# Patient Record
Sex: Female | Born: 1970 | Race: Black or African American | Hispanic: No | Marital: Married | Smoking: Never smoker
Health system: Southern US, Community
[De-identification: ages and names within clinical notes are randomized; demographics above are authoritative.]

## PROBLEM LIST (undated history)

## (undated) ENCOUNTER — Emergency Department (HOSPITAL_COMMUNITY): Admission: EM | Payer: Self-pay | Source: Home / Self Care

## (undated) DIAGNOSIS — E237 Disorder of pituitary gland, unspecified: Secondary | ICD-10-CM

## (undated) DIAGNOSIS — N289 Disorder of kidney and ureter, unspecified: Secondary | ICD-10-CM

## (undated) DIAGNOSIS — I129 Hypertensive chronic kidney disease with stage 1 through stage 4 chronic kidney disease, or unspecified chronic kidney disease: Secondary | ICD-10-CM

## (undated) DIAGNOSIS — N189 Chronic kidney disease, unspecified: Secondary | ICD-10-CM

## (undated) DIAGNOSIS — E119 Type 2 diabetes mellitus without complications: Secondary | ICD-10-CM

## (undated) HISTORY — PX: ABDOMINAL HYSTERECTOMY: SHX81

## (undated) HISTORY — PX: ADVANCEMENT / RECONSTRUCTION POSTERIOR TIBIAL TENDON / KIDNER: SUR17

---

## 2001-01-12 ENCOUNTER — Encounter: Admission: RE | Admit: 2001-01-12 | Discharge: 2001-01-12 | Payer: Self-pay | Admitting: Orthopedic Surgery

## 2014-01-22 ENCOUNTER — Emergency Department (HOSPITAL_COMMUNITY): Payer: Medicare Other

## 2014-01-22 ENCOUNTER — Emergency Department (HOSPITAL_COMMUNITY)
Admission: EM | Admit: 2014-01-22 | Discharge: 2014-01-22 | Disposition: A | Discharge status: Home / Self Care | Payer: Medicare Other | Attending: Emergency Medicine | Admitting: Emergency Medicine

## 2014-01-22 ENCOUNTER — Encounter (HOSPITAL_COMMUNITY): Payer: Self-pay | Admitting: Emergency Medicine

## 2014-01-22 DIAGNOSIS — Z862 Personal history of diseases of the blood and blood-forming organs and certain disorders involving the immune mechanism: Secondary | ICD-10-CM | POA: Diagnosis not present

## 2014-01-22 DIAGNOSIS — M545 Low back pain, unspecified: Secondary | ICD-10-CM | POA: Insufficient documentation

## 2014-01-22 DIAGNOSIS — M25562 Pain in left knee: Secondary | ICD-10-CM

## 2014-01-22 DIAGNOSIS — IMO0002 Reserved for concepts with insufficient information to code with codable children: Secondary | ICD-10-CM | POA: Insufficient documentation

## 2014-01-22 DIAGNOSIS — M542 Cervicalgia: Secondary | ICD-10-CM

## 2014-01-22 DIAGNOSIS — Z87448 Personal history of other diseases of urinary system: Secondary | ICD-10-CM | POA: Insufficient documentation

## 2014-01-22 DIAGNOSIS — M25572 Pain in left ankle and joints of left foot: Secondary | ICD-10-CM

## 2014-01-22 DIAGNOSIS — M25579 Pain in unspecified ankle and joints of unspecified foot: Secondary | ICD-10-CM | POA: Insufficient documentation

## 2014-01-22 DIAGNOSIS — W19XXXD Unspecified fall, subsequent encounter: Secondary | ICD-10-CM

## 2014-01-22 DIAGNOSIS — M25569 Pain in unspecified knee: Secondary | ICD-10-CM | POA: Diagnosis not present

## 2014-01-22 DIAGNOSIS — Z79899 Other long term (current) drug therapy: Secondary | ICD-10-CM | POA: Insufficient documentation

## 2014-01-22 DIAGNOSIS — Z91040 Latex allergy status: Secondary | ICD-10-CM | POA: Insufficient documentation

## 2014-01-22 DIAGNOSIS — Z8639 Personal history of other endocrine, nutritional and metabolic disease: Secondary | ICD-10-CM | POA: Insufficient documentation

## 2014-01-22 DIAGNOSIS — M549 Dorsalgia, unspecified: Secondary | ICD-10-CM | POA: Diagnosis present

## 2014-01-22 HISTORY — DX: Disorder of pituitary gland, unspecified: E23.7

## 2014-01-22 HISTORY — DX: Disorder of kidney and ureter, unspecified: N28.9

## 2014-01-22 MED ORDER — OXYCODONE-ACETAMINOPHEN 5-325 MG PO TABS
1.0000 | ORAL_TABLET | Freq: Once | ORAL | Status: AC
  Administered 2014-01-22: 1 via ORAL
  Filled 2014-01-22: qty 1

## 2014-01-22 MED ORDER — CYCLOBENZAPRINE HCL 10 MG PO TABS
10.0000 mg | ORAL_TABLET | Freq: Once | ORAL | Status: AC
  Administered 2014-01-22: 10 mg via ORAL
  Filled 2014-01-22: qty 1

## 2014-01-22 MED ORDER — PREDNISONE 10 MG PO TABS: 20.0000 mg | ORAL_TABLET | Freq: Every day | ORAL | Status: DC

## 2014-01-22 MED ORDER — OXYCODONE-ACETAMINOPHEN 5-325 MG PO TABS: 1.0000 | ORAL_TABLET | ORAL | Status: DC | PRN

## 2014-01-22 NOTE — ED Provider Notes (Signed)
CSN: [id number]     Arrival date & time 01/22/14  1818 History   First MD Initiated Contact with Patient 01/22/14 2048     Chief Complaint  Patient presents with  . Back Pain     (Consider location/radiation/quality/duration/timing/severity/associated sxs/prior Treatment) HPI.... status post fall at [employer] on July 25. She was initially evaluated at [facility].  She continues to complain of neck pain, low back pain, left knee pain, left ankle pain. No loss of consciousness. No neurological deficits.  . Mild to moderate. Positioning and palpation makes pain worse.  Past Medical History  Diagnosis Date  . Renal disorder   . Pituitary abnormality    Past Surgical History  Procedure Laterality Date  . Advancement / reconstruction posterior tibial tendon / kidner    . Cesarean section    . Abdominal hysterectomy     No family history on file. History  Substance Use Topics  . Smoking status: Never Smoker   . Smokeless tobacco: Not on file  . Alcohol Use: No   OB History   Grav Para Term Preterm Abortions TAB SAB Ect Mult Living                 Review of Systems  All other systems reviewed and are negative.     Allergies  Ibuprofen and Latex  Home Medications   Prior to Admission medications   Medication Sig Start Date End Date Taking? Authorizing Provider  albuterol (PROAIR HFA) 108 (90 BASE) MCG/ACT inhaler Inhale 2 puffs into the lungs every 4 (four) hours as needed for wheezing or shortness of breath.   Yes Historical Provider, MD  budesonide-formoterol (SYMBICORT) 160-4.5 MCG/ACT inhaler Inhale 2 puffs into the lungs 2 (two) times daily.   Yes Historical Provider, MD  bumetanide (BUMEX) 2 MG tablet Take 2 mg by mouth 2 (two) times daily.   Yes Historical Provider, MD  Cholecalciferol (VITAMIN D PO) Take 1 tablet by mouth daily.   Yes Historical Provider, MD  cyclobenzaprine (FLEXERIL) 10 MG tablet Take 10 mg by mouth 3 (three) times daily.   Yes Historical  Provider, MD  estradiol (CLIMARA - DOSED IN MG/24 HR) 0.05 mg/24hr patch Place 0.05 mg onto the skin once a week.   Yes Historical Provider, MD  ferrous sulfate 325 (65 FE) MG tablet Take 325 mg by mouth daily.   Yes Historical Provider, MD  furosemide (LASIX) 80 MG tablet Take 80 mg by mouth 2 (two) times daily.   Yes Historical Provider, MD  MAGNESIUM PO Take 1 tablet by mouth daily.   Yes Historical Provider, MD  metolazone (ZAROXOLYN) 5 MG tablet Take 5 mg by mouth daily.   Yes Historical Provider, MD  potassium chloride (K-DUR) 10 MEQ tablet Take 10 mEq by mouth daily.   Yes Historical Provider, MD  traMADol (ULTRAM) 50 MG tablet Take by mouth 3 (three) times daily.   Yes Historical Provider, MD  valsartan (DIOVAN) 320 MG tablet Take 320 mg by mouth daily.   Yes Historical Provider, MD  oxyCODONE-acetaminophen (PERCOCET) 5-325 MG per tablet Take 1 tablet by mouth every 4 (four) hours as needed. 01/22/14   Donnetta Hutching, MD  predniSONE (DELTASONE) 10 MG tablet Take 2 tablets (20 mg total) by mouth daily. 01/22/14   Donnetta Hutching, MD   BP 129/83  Pulse 83  Temp(Src) 97.8 F (36.6 C) (Oral)  Resp 18  Ht 5\' 4"  (1.626 m)  Wt 220 lb (99.791 kg)  BMI 37.74 kg/m2  SpO2 97% Physical Exam  Nursing note and vitals reviewed. Constitutional: She is oriented to person, place, and time. She appears well-developed and well-nourished.  HENT:  Head: Normocephalic and atraumatic.  Eyes: Conjunctivae and EOM are normal. Pupils are equal, round, and reactive to light.  Neck:   Minimal posterior cervical tenderness, low back tenderness   Cardiovascular: Normal rate, regular rhythm and normal heart sounds.   Pulmonary/Chest: Effort normal and breath sounds normal.  Abdominal: Soft. Bowel sounds are normal.  Musculoskeletal: Normal range of motion.  Minimal anterior left knee tenderness, medial and lateral left ankle tenderness.  Neurological: She is alert and oriented to person, place, and time.  Skin: Skin  is warm and dry.  Psychiatric: She has a normal mood and affect. Her behavior is normal.    ED Course  Procedures (including critical care time) Labs Review Labs Reviewed - No data to display  Imaging Review No results found.   EKG Interpretation None      MDM   Final diagnoses:  Fall, subsequent encounter  Neck pain  Low back pain without sciatica, unspecified back pain laterality  Left knee pain  Left ankle pain    Plain x-rays of cervical spine, lumbar spine, left knee, left ankle negative. Discharge medications Percocet and prednisone. Orthopedic followup.    Donnetta HutchingBrian Taytum Wheller, MD 01/22/14 2207

## 2014-01-22 NOTE — Discharge Instructions (Signed)
X-rays were normal. You'll be sore for several days. Ice pack. Medication for pain and inflammation. Followup with orthopedic doctor if not getting better. Phone number given

## 2014-01-22 NOTE — ED Notes (Signed)
Pt states she is still having pain, pain meds given before, she believes was a placebo, is requesting more pain meds, MD notified.

## 2014-01-22 NOTE — ED Notes (Signed)
Pt reports fell on some wooden plans in July.  C/O pain in lower back and numbness to forearms and lower legs.

## 2014-01-23 ENCOUNTER — Telehealth: Payer: Self-pay | Admitting: Orthopedic Surgery

## 2014-01-23 NOTE — Telephone Encounter (Signed)
01/23/14 Called back to patient to relay per Dr Romeo AppleHarrison; left voice mail message.

## 2014-01-23 NOTE — Telephone Encounter (Signed)
No we will not see [place] patient.

## 2014-01-23 NOTE — Telephone Encounter (Signed)
Patient called today, 01/23/14, following [facility] visit on 01/22/14, for problems of low back pain(main problem),neck pain, left knee, and left ankle pain, related to a fall in July 2015 at [employer], according to Emergency room physician notes, and had been seen initially at [facility] at time of injury.  Patient states not much was done at [facility], and "that's why I went to [facility]."  Patient's insurance is Medicare primary, [place] Medicaid secondary, and we relayed that we are not in [place] Medicaid network; therefore, patient would be responsible for all balances following Medicare, if approved for appointment.  Please review and please advise.  Patient's ph#([place]) # [phone]

## 2016-01-16 IMAGING — CR DG CERVICAL SPINE COMPLETE 4+V
8 of 9 series · 8 of 9 positions shown · non-contrast
Comparison: None.

CLINICAL DATA: Neck pain, status post fall.

EXAM:
CERVICAL SPINE  4+ VIEWS

[view not recorded (1 of 8)]
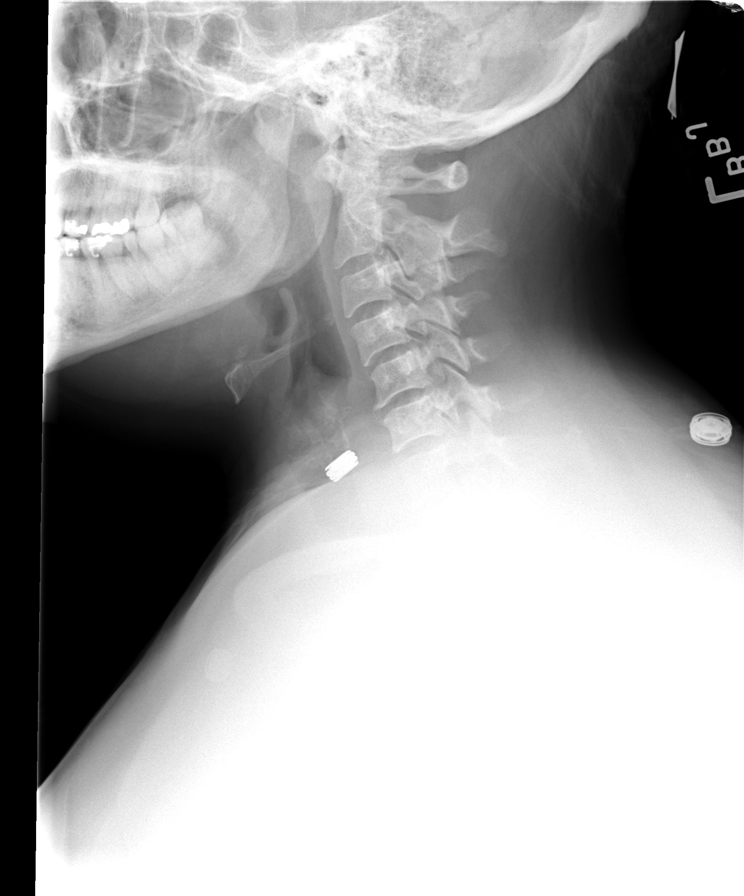

[view not recorded (2 of 8)]
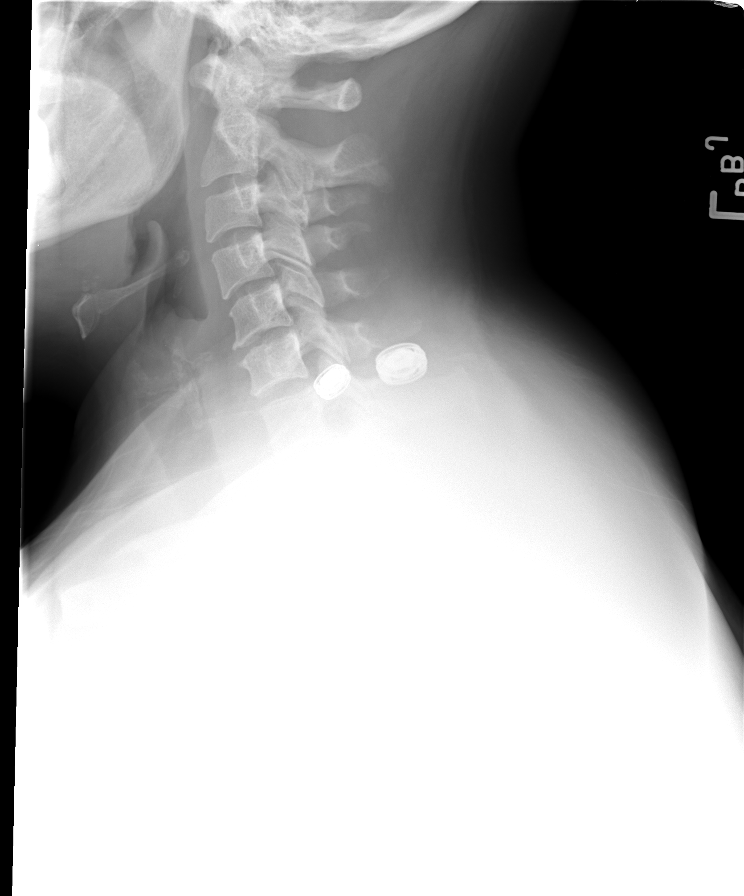

[view not recorded (3 of 8)]
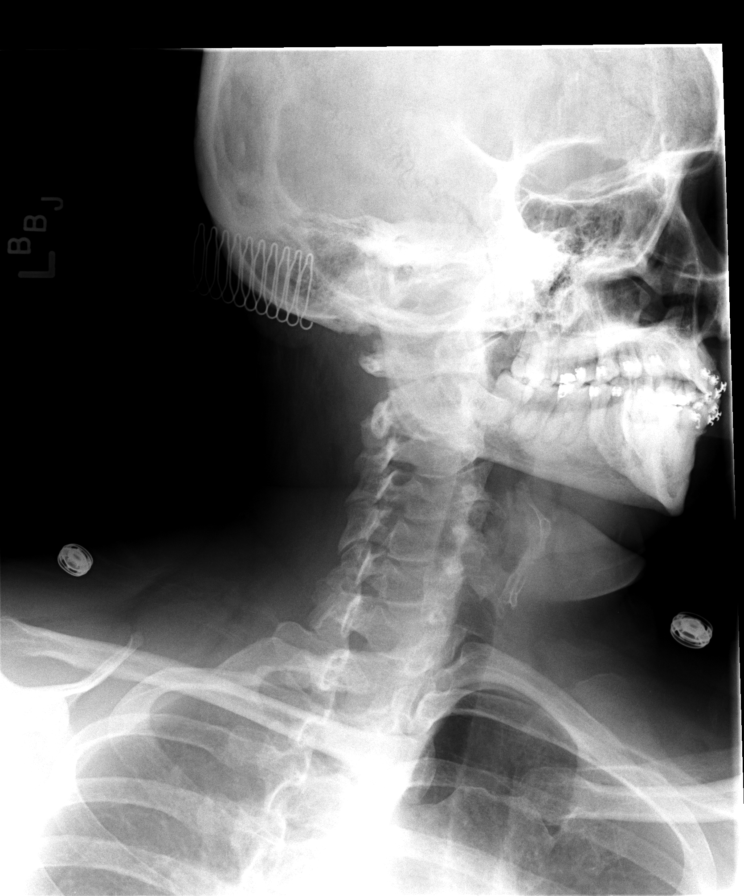

[view not recorded (4 of 8)]
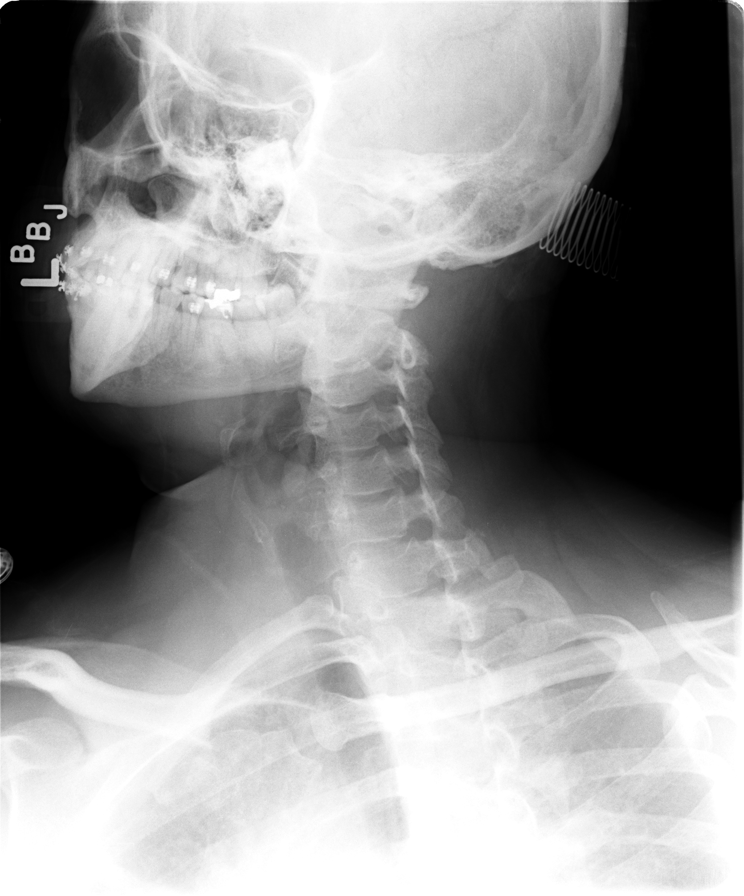

[view not recorded (5 of 8)]
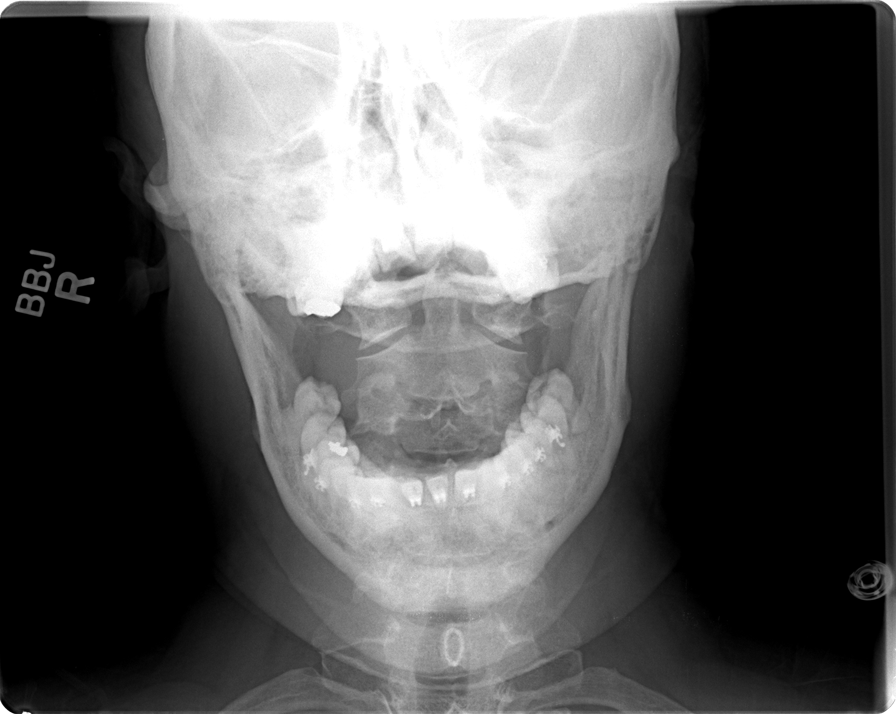

[view not recorded (6 of 8)]
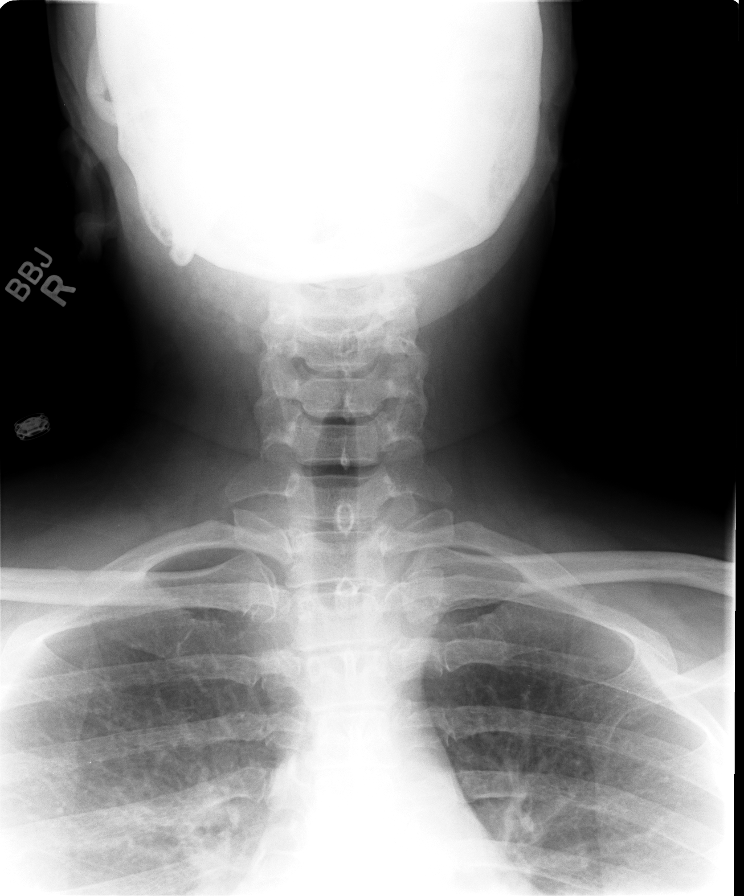

[view not recorded (7 of 8)]
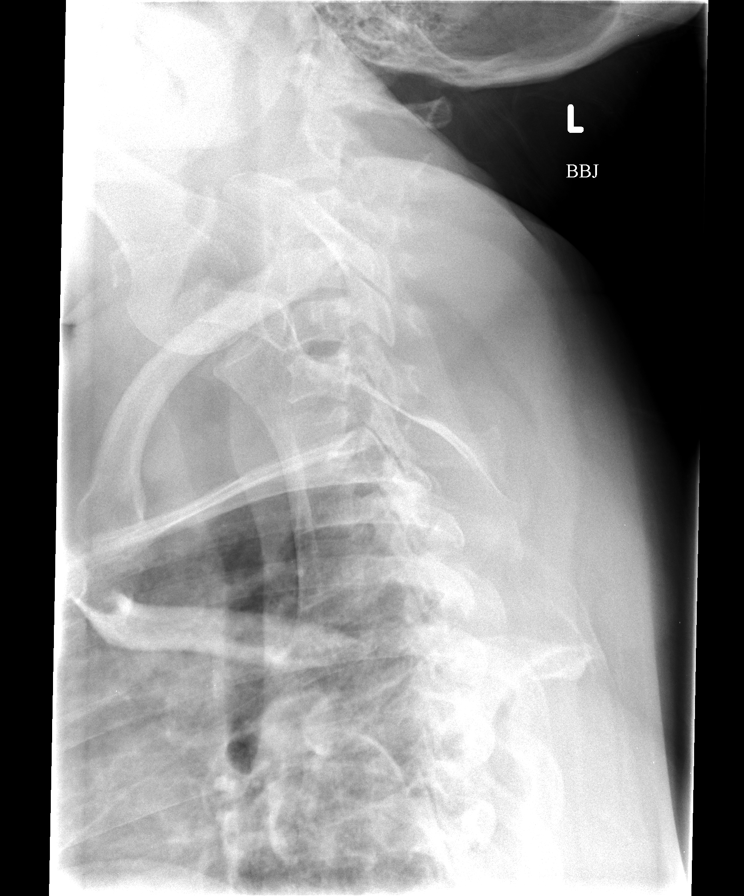

[view not recorded (8 of 8)]
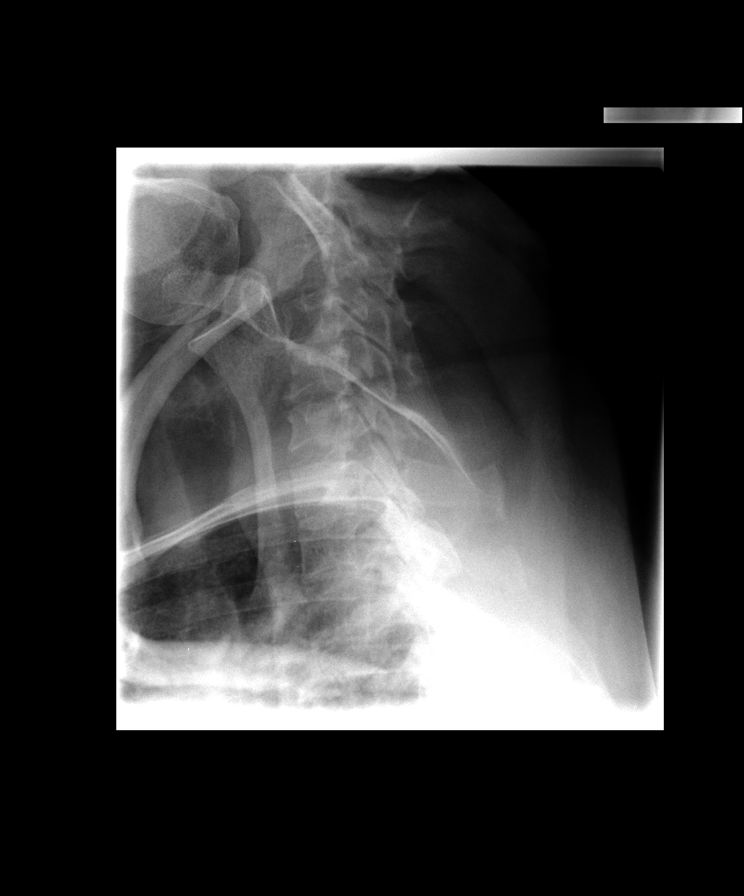

[8 of 9 positions shown; findings below may reference images not displayed]

FINDINGS: There is no evidence of fracture or subluxation. Vertebral bodies
demonstrate normal height and alignment. Intervertebral disc spaces
are preserved. Prevertebral soft tissues are within normal limits.
The provided odontoid view demonstrates no significant abnormality.

The visualized lung apices are clear.
IMPRESSION: No evidence of fracture or subluxation along the cervical spine.

## 2021-09-17 ENCOUNTER — Other Ambulatory Visit: Payer: Self-pay

## 2021-09-17 ENCOUNTER — Emergency Department (HOSPITAL_COMMUNITY)
Admission: EM | Admit: 2021-09-17 | Discharge: 2021-09-17 | Disposition: A | Discharge status: Home / Self Care | Payer: Medicare Other | Attending: Emergency Medicine | Admitting: Emergency Medicine

## 2021-09-17 ENCOUNTER — Encounter (HOSPITAL_COMMUNITY): Payer: Self-pay

## 2021-09-17 DIAGNOSIS — M545 Low back pain, unspecified: Secondary | ICD-10-CM | POA: Diagnosis not present

## 2021-09-17 DIAGNOSIS — R000 Tachycardia, unspecified: Secondary | ICD-10-CM | POA: Diagnosis not present

## 2021-09-17 DIAGNOSIS — E1140 Type 2 diabetes mellitus with diabetic neuropathy, unspecified: Secondary | ICD-10-CM | POA: Insufficient documentation

## 2021-09-17 DIAGNOSIS — Z91040 Latex allergy status: Secondary | ICD-10-CM | POA: Diagnosis not present

## 2021-09-17 HISTORY — DX: Type 2 diabetes mellitus without complications: E11.9

## 2021-09-17 MED ORDER — KETOROLAC TROMETHAMINE 30 MG/ML IJ SOLN
30.0000 mg | Freq: Once | INTRAMUSCULAR | Status: AC
  Administered 2021-09-17: 30 mg via INTRAMUSCULAR
  Filled 2021-09-17: qty 1

## 2021-09-17 NOTE — ED Provider Notes (Signed)
?[place] ?Provider Note ? ? ?CSN: [id number] ?Arrival date & time: 09/17/21  1648 ? ?  ? ?History ? ?Chief Complaint  ?Patient presents with  ? Back Pain  ? ? ?Tina Randolph is a 51 y.o. female with history of diabetes, diabetic neuropathy, lumbar spondylosis, abdominal hysterectomy.  Patient presents ED for evaluation of back pain.  Patient states that since yesterday evening she has been experiencing left-sided low back pain.  Patient denies any preceding trauma or event to account for low back pain.  Patient states this is an ongoing problem for her, she has been seen by pain management specialist in the past as well as her PCP for this issue.  She has undergone procedures to include a nerve ablation.  Patient denies any fevers, lower extremity weakness, bowel or bladder dysfunction, groin numbness.  Patient has multiple prescriptions for muscle relaxers and NSAIDs which she reports she has been taking with moderate relief. ? ? ?Back Pain ?Associated symptoms: no fever, no numbness and no weakness   ? ?  ? ?Home Medications ?Prior to Admission medications   ?Medication Sig Start Date End Date Taking? Authorizing Provider  ?albuterol (PROAIR HFA) 108 (90 BASE) MCG/ACT inhaler Inhale 2 puffs into the lungs every 4 (four) hours as needed for wheezing or shortness of breath.    [provider]  ?budesonide-formoterol (SYMBICORT) 160-4.5 MCG/ACT inhaler Inhale 2 puffs into the lungs 2 (two) times daily.    [provider]  ?bumetanide (BUMEX) 2 MG tablet Take 2 mg by mouth 2 (two) times daily.    [provider]  ?Cholecalciferol (VITAMIN D PO) Take 1 tablet by mouth daily.    [provider]  ?cyclobenzaprine (FLEXERIL) 10 MG tablet Take 10 mg by mouth 3 (three) times daily.    [provider]  ?estradiol (CLIMARA - DOSED IN MG/24 HR) 0.05 mg/24hr patch Place 0.05 mg onto the skin once a week.    [provider]  ?ferrous sulfate 325 (65 FE)  MG tablet Take 325 mg by mouth daily.    [provider]  ?furosemide (LASIX) 80 MG tablet Take 80 mg by mouth 2 (two) times daily.    [provider]  ?MAGNESIUM PO Take 1 tablet by mouth daily.    [provider]  ?metolazone (ZAROXOLYN) 5 MG tablet Take 5 mg by mouth daily.    [provider]  ?oxyCODONE-acetaminophen (PERCOCET) 5-325 MG per tablet Take 1 tablet by mouth every 4 (four) hours as needed. 01/22/14   Nat Christen, MD  ?potassium chloride (K-DUR) 10 MEQ tablet Take 10 mEq by mouth daily.    [provider]  ?predniSONE (DELTASONE) 10 MG tablet Take 2 tablets (20 mg total) by mouth daily. 01/22/14   Nat Christen, MD  ?traMADol Veatrice Bourbon) 50 MG tablet Take by mouth 3 (three) times daily.    [provider]  ?valsartan (DIOVAN) 320 MG tablet Take 320 mg by mouth daily.    [provider]  ?   ? ?Allergies    ?Ibuprofen and Latex   ? ?Review of Systems   ?Review of Systems  ?Constitutional:  Negative for fever.  ?Genitourinary:  Negative for difficulty urinating.  ?Musculoskeletal:  Positive for back pain.  ?Neurological:  Negative for weakness and numbness.  ?All other systems reviewed and are negative. ? ?Physical Exam ?Updated Vital Signs ?BP 116/90 (BP Location: Right Arm)   Pulse (!) 110   Temp 98.1 ?F (36.7 ?C) (Oral)  Resp 16   Ht 5\' 3"  (1.6 m)   Wt 98.4 kg   SpO2 98%   BMI 38.44 kg/m?  ?Physical Exam ?Vitals and nursing note reviewed.  ?Constitutional:   ?   General: She is not in acute distress. ?   Appearance: Normal appearance. She is not ill-appearing, toxic-appearing or diaphoretic.  ?HENT:  ?   Head: Normocephalic and atraumatic.  ?   Nose: Nose normal. No congestion.  ?   Mouth/Throat:  ?   Mouth: Mucous membranes are moist.  ?   Pharynx: Oropharynx is clear.  ?Eyes:  ?   Extraocular Movements: Extraocular movements intact.  ?   Conjunctiva/sclera: Conjunctivae normal.  ?   Pupils: Pupils are equal, round, and reactive to  light.  ?Cardiovascular:  ?   Rate and Rhythm: Normal rate and regular rhythm.  ?Pulmonary:  ?   Effort: Pulmonary effort is normal.  ?   Breath sounds: Normal breath sounds. No wheezing.  ?Abdominal:  ?   General: Abdomen is flat. Bowel sounds are normal.  ?   Palpations: Abdomen is soft.  ?   Tenderness: There is no abdominal tenderness.  ?Musculoskeletal:  ?   Cervical back: Normal, normal range of motion and neck supple. No rigidity or tenderness.  ?   Thoracic back: Normal.  ?   Lumbar back: Tenderness present. No swelling, deformity or lacerations. Normal range of motion.  ?   Comments: Diffuse tenderness to palpation across entire lumbar spine area.  No deformity, step-off noted to patient lumbar spine  ?Skin: ?   General: Skin is warm and dry.  ?   Capillary Refill: Capillary refill takes less than 2 seconds.  ?Neurological:  ?   Mental Status: She is alert and oriented to person, place, and time.  ?   GCS: GCS eye subscore is 4. GCS verbal subscore is 5. GCS motor subscore is 6.  ?   Cranial Nerves: Cranial nerves 2-12 are intact. No cranial nerve deficit.  ?   Sensory: Sensation is intact. No sensory deficit.  ?   Motor: Motor function is intact. No weakness.  ?   Coordination: Coordination is intact. Heel to [facility] Test normal.  ? ? ?ED Results / Procedures / Treatments   ?Labs ?(all labs ordered are listed, but only abnormal results are displayed) ?Labs Reviewed - No data to display ? ?EKG ?None ? ?Radiology ?No results found. ? ?Procedures ?Procedures  ? ?Medications Ordered in ED ?Medications  ?ketorolac (TORADOL) 30 MG/ML injection 30 mg (30 mg Intramuscular Given 09/17/21 1834)  ? ? ?ED Course/ Medical Decision Making/ A&P ?  ?                        ?Medical Decision Making ?Risk ?Prescription drug management. ? ? ?51 year old female presents ED for evaluation of back pain.  Please see HPI for further details. ? ?On examination, patient afebrile, tachycardic to 110, nonhypoxic.  Patient has clear lung  sounds bilaterally.  Patient abdomen is soft compressible 4 quadrants.  Patient lumbar spine has no deformity, step-off noted.  There is diffuse tenderness to the lumbar spine.  Patient has full range of motion to entire back. ? ?On chart review, it appears that the patient has a diagnosis of lumbar spondylosis to the left side.  This is a known problem for the patient.  The patient has been seen by pain management specialist in the past, has had nerve ablation done.  Patient has multiple prescription medications available to her to include muscle relaxers. ? ?Here in the department today, I provided the patient with a 30 mg Toradol shot.  After the shot, she states that she feels much better and is ready to go home.  There is also some discussion amongst the nursing staff about this patient feeling unsafe in her home.  The patient never mentioned this to me, and it appears as if she told nursing staff that she did not wish to pursue any kind of charges against however is making her feel unsafe.  I have provided the patient with resources to include contact information for the National domestic violence hotline.  I spoke with the patient alone prior to discharging her and she stated that she did not wish to escalate this at all urged her to press charges on however it make her feel unsafe. ? ?At this time, patient stable for discharge home.  Patient has been provided with return precautions and she voiced understanding.  Patient and all of her questions answered to her satisfaction.  Patient stable. ? ?Final Clinical Impression(s) / ED Diagnoses ?Final diagnoses:  ?Low back pain, unspecified back pain laterality, unspecified chronicity, unspecified whether sciatica present  ? ? ?Rx / DC Orders ?ED Discharge Orders   ? ? None  ? ?  ? ? ?  ?Azucena Cecil, PA-C ?09/17/21 1909 ? ?  ?Dorie Rank, MD ?09/19/21 0730 ? ?

## 2021-09-17 NOTE — ED Triage Notes (Signed)
Patient reports lower back pain on the left.  Reports numb and tingling to left leg.  Also reports knot in groin.  ?

## 2021-09-17 NOTE — Discharge Instructions (Addendum)
Return to the ED with any new symptoms such as bowel or bladder dysfunction, groin numbness, lower extremity weakness ?Please follow-up with your PCP for ongoing management of your low back pain ?Please read the attached informational guide concerning chronic back pain ?

## 2023-08-17 ENCOUNTER — Other Ambulatory Visit (HOSPITAL_COMMUNITY)
Admission: RE | Admit: 2023-08-17 | Discharge: 2023-08-17 | Disposition: A | Discharge status: Home / Self Care | Payer: Medicare (Managed Care) | Source: Ambulatory Visit | Attending: Nephrology | Admitting: Nephrology

## 2023-08-17 DIAGNOSIS — D631 Anemia in chronic kidney disease: Secondary | ICD-10-CM | POA: Insufficient documentation

## 2023-08-17 DIAGNOSIS — R809 Proteinuria, unspecified: Secondary | ICD-10-CM | POA: Insufficient documentation

## 2023-08-17 DIAGNOSIS — N189 Chronic kidney disease, unspecified: Secondary | ICD-10-CM | POA: Insufficient documentation

## 2023-08-17 LAB — RENAL FUNCTION PANEL
Albumin: 4.2 g/dL (ref 3.5–5.0)
Anion gap: 12 (ref 5–15)
BUN: 24 mg/dL — ABNORMAL HIGH (ref 6–20)
CO2: 33 mmol/L — ABNORMAL HIGH (ref 22–32)
Calcium: 10 mg/dL (ref 8.9–10.3)
Chloride: 96 mmol/L — ABNORMAL LOW (ref 98–111)
Creatinine, Ser: 1.06 mg/dL — ABNORMAL HIGH (ref 0.44–1.00)
GFR, Estimated: >60 mL/min (ref >60)
Glucose, Bld: 74 mg/dL (ref 70–99)
Phosphorus: 2.1 mg/dL — ABNORMAL LOW (ref 2.5–4.6)
Potassium: 3.4 mmol/L — ABNORMAL LOW (ref 3.5–5.1)
Sodium: 141 mmol/L (ref 135–145)

## 2023-08-17 LAB — CBC
HCT: 32.5 % — ABNORMAL LOW (ref 36.0–46.0)
Hemoglobin: 10.4 g/dL — ABNORMAL LOW (ref 12.0–15.0)
MCH: 29.3 pg (ref 26.0–34.0)
MCHC: 32 g/dL (ref 30.0–36.0)
MCV: 91.5 fL (ref 80.0–100.0)
Platelets: 328 10*3/uL (ref 150–400)
RBC: 3.55 MIL/uL — ABNORMAL LOW (ref 3.87–5.11)
RDW: 14.9 % (ref 11.5–15.5)
WBC: 9.3 10*3/uL (ref 4.0–10.5)
nRBC: 0 % (ref 0.0–0.2)

## 2023-08-17 LAB — PROTEIN / CREATININE RATIO, URINE
Creatinine, Urine: 40 mg/dL
Protein Creatinine Ratio: 0.48 mg/mg{creat} — ABNORMAL HIGH (ref 0.00–0.15)
Total Protein, Urine: 19 mg/dL

## 2023-11-19 ENCOUNTER — Other Ambulatory Visit (HOSPITAL_COMMUNITY)
Admission: RE | Admit: 2023-11-19 | Discharge: 2023-11-19 | Disposition: A | Discharge status: Home / Self Care | Source: Ambulatory Visit | Attending: Nephrology | Admitting: Nephrology

## 2023-11-19 DIAGNOSIS — R809 Proteinuria, unspecified: Secondary | ICD-10-CM | POA: Diagnosis not present

## 2023-11-19 DIAGNOSIS — R778 Other specified abnormalities of plasma proteins: Secondary | ICD-10-CM | POA: Insufficient documentation

## 2023-11-19 DIAGNOSIS — Z114 Encounter for screening for human immunodeficiency virus [HIV]: Secondary | ICD-10-CM | POA: Insufficient documentation

## 2023-11-19 DIAGNOSIS — R7309 Other abnormal glucose: Secondary | ICD-10-CM | POA: Insufficient documentation

## 2023-11-19 DIAGNOSIS — D631 Anemia in chronic kidney disease: Secondary | ICD-10-CM | POA: Insufficient documentation

## 2023-11-19 DIAGNOSIS — N189 Chronic kidney disease, unspecified: Secondary | ICD-10-CM | POA: Insufficient documentation

## 2023-11-19 DIAGNOSIS — R769 Abnormal immunological finding in serum, unspecified: Secondary | ICD-10-CM | POA: Diagnosis not present

## 2023-11-19 DIAGNOSIS — E559 Vitamin D deficiency, unspecified: Secondary | ICD-10-CM | POA: Diagnosis not present

## 2023-11-19 LAB — IRON AND TIBC
Iron: 56 ug/dL (ref 28–170)
Saturation Ratios: 13 % (ref 10.4–31.8)
TIBC: 422 ug/dL (ref 250–450)
UIBC: 366 ug/dL

## 2023-11-19 LAB — RENAL FUNCTION PANEL
Albumin: 4.5 g/dL (ref 3.5–5.0)
Anion gap: 18 — ABNORMAL HIGH (ref 5–15)
BUN: 40 mg/dL — ABNORMAL HIGH (ref 6–20)
CO2: 31 mmol/L (ref 22–32)
Calcium: 10.1 mg/dL (ref 8.9–10.3)
Chloride: 88 mmol/L — ABNORMAL LOW (ref 98–111)
Creatinine, Ser: 1.49 mg/dL — ABNORMAL HIGH (ref 0.44–1.00)
GFR, Estimated: 41 mL/min — ABNORMAL LOW (ref >60)
Glucose, Bld: 107 mg/dL — ABNORMAL HIGH (ref 70–99)
Phosphorus: 3.7 mg/dL (ref 2.5–4.6)
Potassium: 3.5 mmol/L (ref 3.5–5.1)
Sodium: 137 mmol/L (ref 135–145)

## 2023-11-19 LAB — CBC
HCT: 33.7 % — ABNORMAL LOW (ref 36.0–46.0)
Hemoglobin: 11.2 g/dL — ABNORMAL LOW (ref 12.0–15.0)
MCH: 30.1 pg (ref 26.0–34.0)
MCHC: 33.2 g/dL (ref 30.0–36.0)
MCV: 90.6 fL (ref 80.0–100.0)
Platelets: 420 10*3/uL — ABNORMAL HIGH (ref 150–400)
RBC: 3.72 MIL/uL — ABNORMAL LOW (ref 3.87–5.11)
RDW: 14.3 % (ref 11.5–15.5)
WBC: 11.6 10*3/uL — ABNORMAL HIGH (ref 4.0–10.5)
nRBC: 0 % (ref 0.0–0.2)

## 2023-11-19 LAB — HIV ANTIBODY (ROUTINE TESTING W REFLEX): HIV Screen 4th Generation wRfx: NONREACTIVE

## 2023-11-19 LAB — URINALYSIS, W/ REFLEX TO CULTURE (INFECTION SUSPECTED)
Bacteria, UA: NONE SEEN
Bilirubin Urine: NEGATIVE
Glucose, UA: NEGATIVE mg/dL
Hgb urine dipstick: NEGATIVE
Ketones, ur: NEGATIVE mg/dL
Nitrite: NEGATIVE
Protein, ur: >=300 mg/dL — AB
Specific Gravity, Urine: 1.014 (ref 1.005–1.030)
pH: 7 (ref 5.0–8.0)

## 2023-11-19 LAB — HEPATITIS B SURFACE ANTIGEN: Hepatitis B Surface Ag: NONREACTIVE

## 2023-11-19 LAB — VITAMIN B12: Vitamin B-12: 1676 pg/mL — ABNORMAL HIGH (ref 180–914)

## 2023-11-19 LAB — VITAMIN D 25 HYDROXY (VIT D DEFICIENCY, FRACTURES): Vit D, 25-Hydroxy: 66.7 ng/mL (ref 30–100)

## 2023-11-19 LAB — MAGNESIUM: Magnesium: 1.3 mg/dL — ABNORMAL LOW (ref 1.7–2.4)

## 2023-11-19 LAB — URIC ACID: Uric Acid, Serum: 10.5 mg/dL — ABNORMAL HIGH (ref 2.5–7.1)

## 2023-11-19 LAB — FOLATE: Folate: 18.9 ng/mL (ref >5.9)

## 2023-11-19 LAB — FERRITIN: Ferritin: 133 ng/mL (ref 11–307)

## 2023-11-20 LAB — HEPATITIS B SURFACE ANTIBODY,QUALITATIVE: Hep B S Ab: REACTIVE — AB

## 2023-11-21 LAB — C4 COMPLEMENT: Complement C4, Body Fluid: 38 mg/dL (ref 12–38)

## 2023-11-21 LAB — HCV AB W REFLEX TO QUANT PCR: HCV Ab: NONREACTIVE

## 2023-11-21 LAB — HCV INTERPRETATION

## 2023-11-21 LAB — HEPATITIS B CORE ANTIBODY, TOTAL: HEP B CORE AB: NEGATIVE

## 2023-11-21 LAB — C3 COMPLEMENT: C3 Complement: 226 mg/dL — ABNORMAL HIGH (ref 82–167)

## 2023-11-22 LAB — HEMOGLOBIN A1C
Hgb A1c MFr Bld: 5.8 % — ABNORMAL HIGH (ref 4.8–5.6)
Mean Plasma Glucose: 120 mg/dL

## 2023-11-22 LAB — MISC LABCORP TEST (SEND OUT): Labcorp test code: 141330

## 2023-11-22 LAB — PROTEIN, URINE, 24 HOUR
Collection Interval-UPROT: 24 h
Protein, Urine: 32 mg/dL
Urine Total Volume-UPROT: 24 mL

## 2023-11-22 LAB — PARATHYROID HORMONE, INTACT (NO CA): PTH: 87 pg/mL — ABNORMAL HIGH (ref 15–65)

## 2023-11-22 LAB — CREATININE, URINE, 24 HOUR
Collection Interval-UCRE24: 24 h
Creatinine, Urine: 52 mg/dL
Urine Total Volume-UCRE24: 24 mL

## 2023-11-22 LAB — ANA: Anti Nuclear Antibody (ANA): NEGATIVE

## 2023-11-23 LAB — PROTEIN ELECTROPHORESIS, SERUM
A/G Ratio: 0.8 (ref 0.7–1.7)
Albumin ELP: 3.7 g/dL (ref 2.9–4.4)
Alpha-1-Globulin: 0.3 g/dL (ref 0.0–0.4)
Alpha-2-Globulin: 1.3 g/dL — ABNORMAL HIGH (ref 0.4–1.0)
Beta Globulin: 1.6 g/dL — ABNORMAL HIGH (ref 0.7–1.3)
Gamma Globulin: 1.5 g/dL (ref 0.4–1.8)
Globulin, Total: 4.7 g/dL — ABNORMAL HIGH (ref 2.2–3.9)
Total Protein ELP: 8.4 g/dL (ref 6.0–8.5)

## 2023-11-23 LAB — COMPLEMENT, TOTAL

## 2023-11-24 LAB — UPEP/UIFE/LIGHT CHAINS/TP, 24-HR UR
% BETA, Urine: 7.4 %
ALPHA 1 URINE: 5 %
Albumin, U: 77.1 %
Alpha 2, Urine: 7.3 %
Free Kappa Lt Chains,Ur: 10.59 mg/L (ref 1.17–86.46)
Free Kappa/Lambda Ratio: 0.4 — ABNORMAL LOW (ref 1.83–14.26)
Free Lambda Lt Chains,Ur: 26.33 mg/L — ABNORMAL HIGH (ref 0.27–15.21)
GAMMA GLOBULIN URINE: 3.2 %
Total Protein, Urine-Ur/day: 1014 mg/(24.h) — ABNORMAL HIGH (ref 30–150)
Total Protein, Urine: 33.8 mg/dL
Total Volume: 3000

## 2023-11-26 ENCOUNTER — Encounter (HOSPITAL_COMMUNITY): Payer: Self-pay | Admitting: *Deleted

## 2023-11-26 ENCOUNTER — Emergency Department (HOSPITAL_COMMUNITY)

## 2023-11-26 ENCOUNTER — Other Ambulatory Visit (HOSPITAL_COMMUNITY): Payer: Self-pay | Admitting: Nephrology

## 2023-11-26 ENCOUNTER — Other Ambulatory Visit: Payer: Self-pay

## 2023-11-26 ENCOUNTER — Emergency Department (HOSPITAL_COMMUNITY)
Admission: EM | Admit: 2023-11-26 | Discharge: 2023-11-26 | Disposition: A | Discharge status: Home / Self Care | Attending: Emergency Medicine | Admitting: Emergency Medicine

## 2023-11-26 DIAGNOSIS — Z91040 Latex allergy status: Secondary | ICD-10-CM | POA: Insufficient documentation

## 2023-11-26 DIAGNOSIS — E1122 Type 2 diabetes mellitus with diabetic chronic kidney disease: Secondary | ICD-10-CM

## 2023-11-26 DIAGNOSIS — R809 Proteinuria, unspecified: Secondary | ICD-10-CM

## 2023-11-26 DIAGNOSIS — E119 Type 2 diabetes mellitus without complications: Secondary | ICD-10-CM | POA: Insufficient documentation

## 2023-11-26 DIAGNOSIS — M62838 Other muscle spasm: Secondary | ICD-10-CM | POA: Insufficient documentation

## 2023-11-26 DIAGNOSIS — N1832 Chronic kidney disease, stage 3b: Secondary | ICD-10-CM

## 2023-11-26 DIAGNOSIS — M542 Cervicalgia: Secondary | ICD-10-CM | POA: Diagnosis present

## 2023-11-26 DIAGNOSIS — N289 Disorder of kidney and ureter, unspecified: Secondary | ICD-10-CM | POA: Insufficient documentation

## 2023-11-26 DIAGNOSIS — D649 Anemia, unspecified: Secondary | ICD-10-CM

## 2023-11-26 HISTORY — DX: Hypertensive chronic kidney disease with stage 1 through stage 4 chronic kidney disease, or unspecified chronic kidney disease: I12.9

## 2023-11-26 HISTORY — DX: Chronic kidney disease, unspecified: N18.9

## 2023-11-26 LAB — CBC WITH DIFFERENTIAL/PLATELET
Abs Immature Granulocytes: 0.04 10*3/uL (ref 0.00–0.07)
Basophils Absolute: 0 10*3/uL (ref 0.0–0.1)
Basophils Relative: 0 %
Eosinophils Absolute: 0.3 10*3/uL (ref 0.0–0.5)
Eosinophils Relative: 3 %
HCT: 30 % — ABNORMAL LOW (ref 36.0–46.0)
Hemoglobin: 9.6 g/dL — ABNORMAL LOW (ref 12.0–15.0)
Immature Granulocytes: 0 %
Lymphocytes Relative: 24 %
Lymphs Abs: 2.6 10*3/uL (ref 0.7–4.0)
MCH: 29.2 pg (ref 26.0–34.0)
MCHC: 32 g/dL (ref 30.0–36.0)
MCV: 91.2 fL (ref 80.0–100.0)
Monocytes Absolute: 0.8 10*3/uL (ref 0.1–1.0)
Monocytes Relative: 8 %
Neutro Abs: 7.1 10*3/uL (ref 1.7–7.7)
Neutrophils Relative %: 65 %
Platelets: 368 10*3/uL (ref 150–400)
RBC: 3.29 MIL/uL — ABNORMAL LOW (ref 3.87–5.11)
RDW: 14.1 % (ref 11.5–15.5)
WBC: 10.9 10*3/uL — ABNORMAL HIGH (ref 4.0–10.5)
nRBC: 0 % (ref 0.0–0.2)

## 2023-11-26 LAB — URINALYSIS, ROUTINE W REFLEX MICROSCOPIC
Bacteria, UA: NONE SEEN
Bilirubin Urine: NEGATIVE
Glucose, UA: NEGATIVE mg/dL
Hgb urine dipstick: NEGATIVE
Ketones, ur: NEGATIVE mg/dL
Nitrite: NEGATIVE
Protein, ur: 100 mg/dL — AB
Specific Gravity, Urine: 1.015 (ref 1.005–1.030)
pH: 6 (ref 5.0–8.0)

## 2023-11-26 LAB — COMPREHENSIVE METABOLIC PANEL WITH GFR
ALT: 12 U/L (ref 0–44)
AST: 18 U/L (ref 15–41)
Albumin: 3.9 g/dL (ref 3.5–5.0)
Alkaline Phosphatase: 64 U/L (ref 38–126)
Anion gap: 13 (ref 5–15)
BUN: 40 mg/dL — ABNORMAL HIGH (ref 6–20)
CO2: 27 mmol/L (ref 22–32)
Calcium: 9.9 mg/dL (ref 8.9–10.3)
Chloride: 93 mmol/L — ABNORMAL LOW (ref 98–111)
Creatinine, Ser: 1.89 mg/dL — ABNORMAL HIGH (ref 0.44–1.00)
GFR, Estimated: 31 mL/min — ABNORMAL LOW (ref >60)
Glucose, Bld: 131 mg/dL — ABNORMAL HIGH (ref 70–99)
Potassium: 3.4 mmol/L — ABNORMAL LOW (ref 3.5–5.1)
Sodium: 133 mmol/L — ABNORMAL LOW (ref 135–145)
Total Bilirubin: 0.5 mg/dL (ref 0.0–1.2)
Total Protein: 8.6 g/dL — ABNORMAL HIGH (ref 6.5–8.1)

## 2023-11-26 LAB — TROPONIN I (HIGH SENSITIVITY): Troponin I (High Sensitivity): 2 ng/L (ref <18)

## 2023-11-26 MED ORDER — IOHEXOL 300 MG/ML  SOLN
60.0000 mL | Freq: Once | INTRAMUSCULAR | Status: AC | PRN
  Administered 2023-11-26: 60 mL via INTRAVENOUS

## 2023-11-26 MED ORDER — METHOCARBAMOL 500 MG PO TABS
500.0000 mg | ORAL_TABLET | Freq: Once | ORAL | Status: AC
  Administered 2023-11-26: 500 mg via ORAL
  Filled 2023-11-26: qty 1

## 2023-11-26 MED ORDER — SODIUM CHLORIDE 0.9 % IV BOLUS
500.0000 mL | Freq: Once | INTRAVENOUS | Status: AC
  Administered 2023-11-26: 500 mL via INTRAVENOUS

## 2023-11-26 NOTE — ED Notes (Signed)
 MD notified re: pts request for imaging. Pt states, I think my neck or something is the problem. I also had clots in the past and can't take blood thinners. My speech comes and goes and is different at times. Will update the pt on plan of care.

## 2023-11-26 NOTE — ED Triage Notes (Addendum)
 Pt c/o left sided neck pain that started last night before bed. She reports she woke up in the middle of the night with the tightness now going all around the neck and up into the back of her head and into her jaw bilaterally. Denies CP and SOB. Pt reports episode of nausea and dizziness yesterday.

## 2023-11-26 NOTE — ED Provider Notes (Signed)
 [place] EMERGENCY DEPARTMENT AT [facility] Provider Note   CSN: [id number] Arrival date & time: 11/26/23  1914     Patient presents with: Neck Pain   Tina Randolph is a 53 y.o. female.   HPI   This patient is a 53 year old female, she has a history of multiple medical problems, she has been diagnosed with ischemic heart disease, bilateral low back pain, type 2 diabetes, anemia, chronic pain of multiple different areas including her back and her knees, she is followed at the [facility] clinic and [place]  but for some reason came to this hospital today.  She reports that yesterday she was having some discomfort around her upper neck radiating around into the back of her neck on the back of her head and down into her shoulders, she states I just do not feel like myself and has a little bit of slowed responses.  She is accompanied by her significant other who does not add any valuable information except she takes a lot of medications.  The patient does not have a list of her medications with her and the medications that we have in the computer are about 10 years out of date.  Prior to Admission medications   Medication Sig Start Date End Date Taking? Authorizing Provider  allopurinol (ZYLOPRIM) 100 MG tablet Take 100 mg by mouth daily. 2 tablets once daily   Yes [provider]  bismuth subsalicylate (PEPTO BISMOL) 262 MG chewable tablet Chew 524 mg by mouth as needed.   Yes [provider]  Cholecalciferol (VITAMIN D3) 250 MCG (10000 UT) capsule Take 10,000 Units by mouth daily.   Yes [provider]  Cyanocobalamin 1000 MCG/ML KIT Inject 1,000 mcg as directed every 30 (thirty) days.   Yes [provider]  diclofenac Sodium (VOLTAREN) 1 % GEL Apply 1 g topically 2 (two) times daily.   Yes [provider]  doxycycline (VIBRAMYCIN) 100 MG capsule Take 100 mg by mouth 2 (two) times daily.   Yes [provider]   Ergocalciferol  (VITAMIN D2 PO) Take by mouth.   Yes [provider]  fluocinonide (LIDEX) 0.05 % external solution Apply 1 Application topically 2 (two) times daily.   Yes [provider]  levothyroxine (SYNTHROID) 112 MCG tablet Take 112 mcg by mouth daily before breakfast.   Yes [provider]  methocarbamol (ROBAXIN) 500 MG tablet Take 500 mg by mouth 3 (three) times daily as needed for muscle spasms.   Yes [provider]  omeprazole (PRILOSEC) 20 MG capsule Take 20 mg by mouth 2 (two) times daily.   Yes [provider]  Glynda Lash Calcium 500 MG TABS Take 1 tablet by mouth in the morning and at bedtime.   Yes [provider]  Semaglutide, 1 MG/DOSE, (OZEMPIC, 1 MG/DOSE,) 4 MG/3ML SOPN Inject 1 mg into the skin once a week.   Yes [provider]  tiZANidine (ZANAFLEX) 4 MG tablet Take 4 mg by mouth at bedtime.   Yes [provider]  albuterol (PROAIR HFA) 108 (90 BASE) MCG/ACT inhaler Inhale 2 puffs into the lungs every 6 (six) hours as needed for shortness of breath.    [provider]  budesonide-formoterol (SYMBICORT) 160-4.5 MCG/ACT inhaler Inhale 2 puffs into the lungs 2 (two) times daily.    [provider]  bumetanide (BUMEX) 2 MG tablet Take 2 mg by mouth 2 (two) times daily.    [provider]  Cholecalciferol (VITAMIN D  PO) Take  1 tablet by mouth daily.    [provider]  estradiol (CLIMARA - DOSED IN MG/24 HR) 0.05 mg/24hr patch Place 0.05 mg onto the skin once a week.    [provider]  ferrous sulfate 325 (65 FE) MG tablet Take 325 mg by mouth every evening.    [provider]  MAGNESIUM PO Take 400 mg by mouth in the morning.    [provider]  metolazone (ZAROXOLYN) 5 MG tablet Take 5 mg by mouth daily.    [provider]  potassium chloride (K-DUR) 10 MEQ tablet Take 20 mEq by mouth 2 (two) times daily.    [provider]   valsartan (DIOVAN) 320 MG tablet Take 160 mg by mouth daily.    [provider]    Allergies: Ibuprofen and Latex    Review of Systems  All other systems reviewed and are negative.   Updated Vital Signs BP 94/67   Pulse 76   Temp 98.5 F (36.9 C) (Oral)   Resp 15   Ht 1.6 m (5' 3)   Wt 97.5 kg   SpO2 97%   BMI 38.09 kg/m   Physical Exam Vitals and nursing note reviewed.  Constitutional:      General: She is not in acute distress.    Appearance: She is well-developed.  HENT:     Head: Normocephalic and atraumatic.     Mouth/Throat:     Pharynx: No oropharyngeal exudate.   Eyes:     General: No scleral icterus.       Right eye: No discharge.        Left eye: No discharge.     Conjunctiva/sclera: Conjunctivae normal.     Pupils: Pupils are equal, round, and reactive to light.   Neck:     Thyroid: No thyromegaly.     Vascular: No JVD.   Cardiovascular:     Rate and Rhythm: Normal rate and regular rhythm.     Heart sounds: Normal heart sounds. No murmur heard.    No friction rub. No gallop.  Pulmonary:     Effort: Pulmonary effort is normal. No respiratory distress.     Breath sounds: Normal breath sounds. No wheezing or rales.  Abdominal:     General: Bowel sounds are normal. There is no distension.     Palpations: Abdomen is soft. There is no mass.     Tenderness: There is no abdominal tenderness.   Musculoskeletal:        General: Tenderness present. Normal range of motion.     Cervical back: Normal range of motion and neck supple.     Comments: Tenderness on the posterior neck and up onto the scalp as well as down into around the neck on the bilateral anterior neck tracking just inferior to the jaw and down into the trapezius bilaterally  Lymphadenopathy:     Cervical: No cervical adenopathy.   Skin:    General: Skin is warm and dry.     Findings: No erythema or rash.   Neurological:     Mental Status: She is alert.     Coordination:  Coordination normal.     Comments: Able to move all 4 extremities with normal strength and sensation, she has generally slowed movements and her speech is slightly slow but accurate and clear.  She has totally normal strength and sensation of the bilateral upper extremities including grips and coordination  Psychiatric:        Behavior: Behavior  normal.     (all labs ordered are listed, but only abnormal results are displayed) Labs Reviewed  CBC WITH DIFFERENTIAL/PLATELET - Abnormal; Notable for the following components:      Result Value   WBC 10.9 (*)    RBC 3.29 (*)    Hemoglobin 9.6 (*)    HCT 30.0 (*)    All other components within normal limits  COMPREHENSIVE METABOLIC PANEL WITH GFR - Abnormal; Notable for the following components:   Sodium 133 (*)    Potassium 3.4 (*)    Chloride 93 (*)    Glucose, Bld 131 (*)    BUN 40 (*)    Creatinine, Ser 1.89 (*)    Total Protein 8.6 (*)    GFR, Estimated 31 (*)    All other components within normal limits  URINALYSIS, ROUTINE W REFLEX MICROSCOPIC - Abnormal; Notable for the following components:   Protein, ur 100 (*)    Leukocytes,Ua SMALL (*)    All other components within normal limits  TROPONIN I (HIGH SENSITIVITY)    EKG: EKG Interpretation Date/Time:  Friday November 26 2023 07:32:44 EDT Ventricular Rate:  83 PR Interval:  155 QRS Duration:  88 QT Interval:  379 QTC Calculation: 446 R Axis:   57  Text Interpretation: Sinus rhythm Inferior infarct, age indeterminate No old tracing to compare Confirmed by Early Glisson ([id number]) on 11/26/2023 7:41:14 AM  Radiology: CT VENOGRAM HEAD Result Date: 11/26/2023 CLINICAL DATA:  Headache, new onset (Age >= 51y) EXAM: CT VENOGRAM HEAD TECHNIQUE: Venographic phase images of the brain were obtained following the administration of intravenous contrast. Multiplanar reformats and maximum intensity projections were generated. RADIATION DOSE REDUCTION: This exam was performed according to the  departmental dose-optimization program which includes automated exposure control, adjustment of the mA and/or kV according to patient size and/or use of iterative reconstruction technique. CONTRAST:  60mL OMNIPAQUE IOHEXOL 300 MG/ML  SOLN COMPARISON:  None Available. FINDINGS: The venous sinuses and major cortical veins appear widely patent. There is no evidence of thrombosis. There is mild generalized cerebral volume loss present. The brain otherwise appears normal. There is no evidence of hemorrhage, mass, cortical infarct or hydrocephalus. IMPRESSION: 1. Normal CT venogram. 2. Normal brain. Electronically Signed   By: Maribeth Shivers M.D.   On: 11/26/2023 10:20   DG Chest Port 1 View Result Date: 11/26/2023 CLINICAL DATA:  altered, neck pain EXAM: PORTABLE CHEST 1 VIEW COMPARISON:  None Available. FINDINGS: The heart size and mediastinal contours are within normal limits. No focal consolidation, sizeable pleural effusion, or pneumothorax. No acute osseous abnormality. IMPRESSION: No acute cardiopulmonary findings. Electronically Signed   By: Mannie Seek M.D.   On: 11/26/2023 08:11     Procedures   Medications Ordered in the ED  methocarbamol (ROBAXIN) tablet 500 mg (500 mg Oral Given 11/26/23 0803)  sodium chloride 0.9 % bolus 500 mL (500 mLs Intravenous New Bag/Given 11/26/23 1012)  iohexol (OMNIPAQUE) 300 MG/ML solution 60 mL (60 mLs Intravenous Contrast Given 11/26/23 2130)                                    Medical Decision Making Amount and/or Complexity of Data Reviewed Labs: ordered. Radiology: ordered. ECG/medicine tests: ordered.  Risk Prescription drug management.    This patient presents to the ED for concern of neck and posterior occipital pain differential diagnosis includes muscular etiology seems to be the most  likely, would consider some other things as well especially with some abnormal jaw discomfort without reproducible tenderness on the jaw, would consider  cardiac cause    Additional history obtained   Additional history obtained from Electronic Medical Record External records from outside source obtained and reviewed including tried to review the patient's medical records but the seem to be in a different system and not immediately available   Lab Tests:  I Ordered, and personally interpreted labs.  The pertinent results include: Progressive renal insufficiency BUN of 40, creatinine of 1.89, this is up from prior lab values, chronic anemia hemoglobin of 9.6, normal platelets normal MCV no significant leukocytosis, troponin is negative and urinalysis reveals proteinuria no bacteria   Imaging Studies ordered:  I ordered imaging studies including chest x-ray I independently visualized and interpreted imaging which showed no acute findings I agree with the radiologist interpretation CT scans without signs of venous sinus thrombosis or intracranial abnormalities   Medicines ordered and prescription drug management:  I ordered medication including IV fluids    I have reviewed the patients home medicines and have made adjustments as needed I reviewed the patient's home medication list with her, she understands which medications to stop, she has been prescribed both tizanidine and Robaxin both of which she is taking at this time and neither of which are helping with her neck.  I recommended that she try alternative treatments such as acupuncture or massage or chiropractic since the other medications are not helpful and she is already on so many medications that adding another 1 might be harmful She expressed understanding.  She was also informed of her current kidney function and will follow-up with her nephrologist   Problem List / ED Course:  I discussed the case with Dr. Irene Mannheim of the nephrology service who recommends that the patient's stop the Kerendia, until she follows up with Dr. Carrolyn Clan and recommends a small amount of IV fluids  here   Social Determinants of Health:  Renal insufficiency        Final diagnoses:  Renal insufficiency  Muscle spasms of neck    ED Discharge Orders     None          Early Glisson, MD 11/26/23 1037

## 2023-11-26 NOTE — Discharge Instructions (Signed)
 GFR is 31, this is decreased from what it has been.  Additionally you have been prescribed multiple different muscle relaxers and I would recommend that you do not take either of them, your family doctor should not be having you take both of those.  Talk to your family doctor about the rest of your medications as they may need to change some things but at this time rest assured that there was no signs of stroke, blood clots, tumors or any other abnormalities on your CT scans, you do need to drink some extra fluids as your kidneys do appear a little bit dehydrated.  Please follow-up with Dr. Carrolyn Clan, call the office today to let them know that you were seen in the ER and need follow-up within 7 days.  ER for severe worsening symptoms

## 2023-12-06 ENCOUNTER — Ambulatory Visit (HOSPITAL_COMMUNITY)
Admission: RE | Admit: 2023-12-06 | Discharge: 2023-12-06 | Disposition: A | Discharge status: Home / Self Care | Source: Ambulatory Visit | Attending: Nephrology | Admitting: Nephrology

## 2023-12-06 DIAGNOSIS — E1122 Type 2 diabetes mellitus with diabetic chronic kidney disease: Secondary | ICD-10-CM | POA: Insufficient documentation

## 2023-12-06 DIAGNOSIS — D649 Anemia, unspecified: Secondary | ICD-10-CM | POA: Diagnosis present

## 2023-12-06 DIAGNOSIS — N1832 Chronic kidney disease, stage 3b: Secondary | ICD-10-CM | POA: Diagnosis present

## 2023-12-06 DIAGNOSIS — R809 Proteinuria, unspecified: Secondary | ICD-10-CM | POA: Diagnosis present

## 2024-01-10 ENCOUNTER — Encounter (HOSPITAL_COMMUNITY): Payer: Self-pay

## 2024-01-10 ENCOUNTER — Other Ambulatory Visit (HOSPITAL_COMMUNITY)
Admission: RE | Admit: 2024-01-10 | Discharge: 2024-01-10 | Disposition: A | Discharge status: Home / Self Care | Source: Ambulatory Visit | Attending: Nephrology | Admitting: Nephrology

## 2024-01-10 ENCOUNTER — Emergency Department (HOSPITAL_COMMUNITY)
Admission: EM | Admit: 2024-01-10 | Discharge: 2024-01-10 | Disposition: A | Discharge status: Home / Self Care | Source: Ambulatory Visit | Attending: Emergency Medicine | Admitting: Emergency Medicine

## 2024-01-10 ENCOUNTER — Other Ambulatory Visit: Payer: Self-pay

## 2024-01-10 DIAGNOSIS — N189 Chronic kidney disease, unspecified: Secondary | ICD-10-CM | POA: Diagnosis present

## 2024-01-10 DIAGNOSIS — D631 Anemia in chronic kidney disease: Secondary | ICD-10-CM | POA: Insufficient documentation

## 2024-01-10 DIAGNOSIS — R809 Proteinuria, unspecified: Secondary | ICD-10-CM | POA: Insufficient documentation

## 2024-01-10 DIAGNOSIS — E876 Hypokalemia: Secondary | ICD-10-CM | POA: Insufficient documentation

## 2024-01-10 LAB — RENAL FUNCTION PANEL
Albumin: 4.2 g/dL (ref 3.5–5.0)
Anion gap: 20 — ABNORMAL HIGH (ref 5–15)
BUN: 63 mg/dL — ABNORMAL HIGH (ref 6–20)
CO2: 24 mmol/L (ref 22–32)
Calcium: 10.6 mg/dL — ABNORMAL HIGH (ref 8.9–10.3)
Chloride: 87 mmol/L — ABNORMAL LOW (ref 98–111)
Creatinine, Ser: 1.73 mg/dL — ABNORMAL HIGH (ref 0.44–1.00)
GFR, Estimated: 35 mL/min — ABNORMAL LOW (ref >60)
Glucose, Bld: 111 mg/dL — ABNORMAL HIGH (ref 70–99)
Phosphorus: 3.5 mg/dL (ref 2.5–4.6)
Potassium: 2.7 mmol/L — CL (ref 3.5–5.1)
Sodium: 131 mmol/L — ABNORMAL LOW (ref 135–145)

## 2024-01-10 LAB — COMPREHENSIVE METABOLIC PANEL WITH GFR
ALT: 21 U/L (ref 0–44)
AST: 21 U/L (ref 15–41)
Albumin: 4.3 g/dL (ref 3.5–5.0)
Alkaline Phosphatase: 75 U/L (ref 38–126)
Anion gap: 18 — ABNORMAL HIGH (ref 5–15)
BUN: 66 mg/dL — ABNORMAL HIGH (ref 6–20)
CO2: 30 mmol/L (ref 22–32)
Calcium: 10.7 mg/dL — ABNORMAL HIGH (ref 8.9–10.3)
Chloride: 86 mmol/L — ABNORMAL LOW (ref 98–111)
Creatinine, Ser: 1.98 mg/dL — ABNORMAL HIGH (ref 0.44–1.00)
GFR, Estimated: 29 mL/min — ABNORMAL LOW (ref >60)
Glucose, Bld: 157 mg/dL — ABNORMAL HIGH (ref 70–99)
Potassium: 3.1 mmol/L — ABNORMAL LOW (ref 3.5–5.1)
Sodium: 134 mmol/L — ABNORMAL LOW (ref 135–145)
Total Bilirubin: 0.6 mg/dL (ref 0.0–1.2)
Total Protein: 8.9 g/dL — ABNORMAL HIGH (ref 6.5–8.1)

## 2024-01-10 MED ORDER — POTASSIUM CHLORIDE 10 MEQ/100ML IV SOLN
10.0000 meq | Freq: Once | INTRAVENOUS | Status: AC
  Administered 2024-01-10: 10 meq via INTRAVENOUS
  Filled 2024-01-10: qty 100

## 2024-01-10 MED ORDER — POTASSIUM CHLORIDE CRYS ER 20 MEQ PO TBCR
40.0000 meq | EXTENDED_RELEASE_TABLET | Freq: Once | ORAL | Status: AC
  Administered 2024-01-10: 40 meq via ORAL
  Filled 2024-01-10: qty 2

## 2024-01-10 NOTE — Progress Notes (Deleted)
 Lab called this RN due to having problems finding contact information for ordering physician.   Pt has potassium level of 2.7,  Received call at 18;25.  I spoke with Dr Rachele and informed him of potassium level at 18:30,  No additional orders were given   Viggo Perko RN

## 2024-01-10 NOTE — Progress Notes (Signed)
 igned      Lab called this RN due to having problems finding contact information for ordering physician.    Pt has potassium level of 2.7,  Received call at 18;25.  I spoke with Dr Rachele and informed him of potassium level at 18:30,  No additional orders were given    Jasper Ruminski RN

## 2024-01-10 NOTE — ED Triage Notes (Signed)
 Pt had blood work drawn today and got a call stating that potassium was 2.7. Pt takes potassium daily. Pt endorses feelings of being tired and weakness x2 weeks.

## 2024-01-10 NOTE — Discharge Instructions (Signed)
 Increase your potassium so you are taking 5 pills a day and follow-up with either your family doctor or your nephrologist next week

## 2024-01-12 NOTE — ED Provider Notes (Signed)
 [place] EMERGENCY DEPARTMENT AT [facility] Provider Note   CSN: [id number] Arrival date & time: 01/10/24  8042     Patient presents with: Hypokalemia    Tina Randolph is a 53 y.o. female.   Pt.was sent by her nephrologist for hypokalemia  The history is provided by the patient and medical records.  Weakness Severity:  Mild Onset quality:  Gradual Timing:  Intermittent Progression:  Resolved Chronicity:  Recurrent Context: not alcohol use   Relieved by:  Nothing Worsened by:  Nothing Ineffective treatments:  None tried Associated symptoms: no abdominal pain, no chest pain, no cough, no diarrhea, no frequency, no headaches and no seizures        Prior to Admission medications   Medication Sig Start Date End Date Taking? Authorizing Provider  albuterol (PROAIR HFA) 108 (90 BASE) MCG/ACT inhaler Inhale 2 puffs into the lungs every 6 (six) hours as needed for shortness of breath.    [provider]  allopurinol (ZYLOPRIM) 100 MG tablet Take 100 mg by mouth daily. 2 tablets once daily    [provider]  bismuth subsalicylate (PEPTO BISMOL) 262 MG chewable tablet Chew 524 mg by mouth as needed.    [provider]  budesonide-formoterol (SYMBICORT) 160-4.5 MCG/ACT inhaler Inhale 2 puffs into the lungs 2 (two) times daily.    [provider]  bumetanide (BUMEX) 2 MG tablet Take 2 mg by mouth 2 (two) times daily.    [provider]  Cholecalciferol (VITAMIN D  PO) Take 1 tablet by mouth daily.    [provider]  Cholecalciferol (VITAMIN D3) 250 MCG (10000 UT) capsule Take 10,000 Units by mouth daily.    [provider]  Cyanocobalamin  1000 MCG/ML KIT Inject 1,000 mcg as directed every 30 (thirty) days.    [provider]  diclofenac Sodium (VOLTAREN) 1 % GEL Apply 1 g topically 2 (two) times daily.    [provider]  doxycycline (VIBRAMYCIN) 100 MG capsule Take 100 mg by mouth 2 (two)  times daily.    [provider]  Ergocalciferol  (VITAMIN D2 PO) Take by mouth.    [provider]  estradiol (CLIMARA - DOSED IN MG/24 HR) 0.05 mg/24hr patch Place 0.05 mg onto the skin once a week.    [provider]  ferrous sulfate 325 (65 FE) MG tablet Take 325 mg by mouth every evening.    [provider]  fluocinonide (LIDEX) 0.05 % external solution Apply 1 Application topically 2 (two) times daily.    [provider]  levothyroxine (SYNTHROID) 112 MCG tablet Take 112 mcg by mouth daily before breakfast.    [provider]  MAGNESIUM PO Take 400 mg by mouth in the morning.    [provider]  methocarbamol  (ROBAXIN ) 500 MG tablet Take 500 mg by mouth 3 (three) times daily as needed for muscle spasms.    [provider]  metolazone (ZAROXOLYN) 5 MG tablet Take 5 mg by mouth daily.    [provider]  omeprazole (PRILOSEC) 20 MG capsule Take 20 mg by mouth 2 (two) times daily.    [provider]  Allie Gutta Calcium 500 MG TABS Take 1 tablet by mouth in the morning and at bedtime.    [provider]  potassium chloride  (K-DUR) 10 MEQ tablet Take 20 mEq by mouth 2 (two) times daily.    [provider]  Semaglutide, 1 MG/DOSE, (OZEMPIC, 1 MG/DOSE,) 4 MG/3ML SOPN Inject 1 mg into  the skin once a week.    [provider]  tiZANidine (ZANAFLEX) 4 MG tablet Take 4 mg by mouth at bedtime.    [provider]  valsartan (DIOVAN) 320 MG tablet Take 160 mg by mouth daily.    [provider]    Allergies: Ibuprofen and Latex    Review of Systems  Constitutional:  Negative for appetite change and fatigue.  HENT:  Negative for congestion, ear discharge and sinus pressure.   Eyes:  Negative for discharge.  Respiratory:  Negative for cough.   Cardiovascular:  Negative for chest pain.  Gastrointestinal:  Negative for abdominal pain and diarrhea.  Genitourinary:   Negative for frequency and hematuria.  Musculoskeletal:  Negative for back pain.  Skin:  Negative for rash.  Neurological:  Positive for weakness. Negative for seizures and headaches.  Psychiatric/Behavioral:  Negative for hallucinations.     Updated Vital Signs BP 110/77   Pulse 90   Temp 98.1 F (36.7 C)   Resp 11   SpO2 97%   Physical Exam Vitals and nursing note reviewed.  Constitutional:      Appearance: She is well-developed.  HENT:     Head: Normocephalic.     Nose: Nose normal.  Eyes:     General: No scleral icterus.    Conjunctiva/sclera: Conjunctivae normal.  Neck:     Thyroid: No thyromegaly.  Cardiovascular:     Rate and Rhythm: Normal rate and regular rhythm.     Heart sounds: No murmur heard.    No friction rub. No gallop.  Pulmonary:     Breath sounds: No stridor. No wheezing or rales.  Chest:     Chest wall: No tenderness.  Abdominal:     General: There is no distension.     Tenderness: There is no abdominal tenderness. There is no rebound.  Musculoskeletal:        General: Normal range of motion.     Cervical back: Neck supple.  Lymphadenopathy:     Cervical: No cervical adenopathy.  Skin:    Findings: No erythema or rash.  Neurological:     Mental Status: She is alert and oriented to person, place, and time.     Motor: No abnormal muscle tone.     Coordination: Coordination normal.  Psychiatric:        Behavior: Behavior normal.     (all labs ordered are listed, but only abnormal results are displayed) Labs Reviewed  COMPREHENSIVE METABOLIC PANEL WITH GFR - Abnormal; Notable for the following components:      Result Value   Sodium 134 (*)    Potassium 3.1 (*)    Chloride 86 (*)    Glucose, Bld 157 (*)    BUN 66 (*)    Creatinine, Ser 1.98 (*)    Calcium 10.7 (*)    Total Protein 8.9 (*)    GFR, Estimated 29 (*)    Anion gap 18 (*)    All other components within normal limits    EKG: EKG Interpretation Date/Time:  Monday January 10 2024 20:46:16 EDT Ventricular Rate:  86 PR Interval:  142 QRS Duration:  89 QT Interval:  355 QTC Calculation: 425 R Axis:   71  Text Interpretation: Sinus rhythm Abnormal T, consider ischemia, diffuse leads Confirmed by Griselda Norris [phone]) on 01/11/2024 12:16:13 PM  Radiology: No results found.   Procedures   Medications Ordered in the ED  potassium chloride  SA (KLOR-CON  M) CR tablet 40 mEq (  40 mEq Oral Given 01/10/24 2050)  potassium chloride  10 mEq in 100 mL IVPB (0 mEq Intravenous Stopped 01/10/24 2300)                                    Medical Decision Making Amount and/or Complexity of Data Reviewed Labs: ordered. ECG/medicine tests: ordered.  Risk Prescription drug management.   Pt with mild hypokalemia.  Pt given iv and po potassium and will follow up with her md     Final diagnoses:  Hypokalemia    ED Discharge Orders     None          Suzette Pac, MD 01/12/24 1239

## 2024-02-07 ENCOUNTER — Other Ambulatory Visit (HOSPITAL_COMMUNITY)
Admission: RE | Admit: 2024-02-07 | Discharge: 2024-02-07 | Disposition: A | Discharge status: Home / Self Care | Source: Ambulatory Visit | Attending: Nephrology | Admitting: Nephrology

## 2024-02-07 DIAGNOSIS — R809 Proteinuria, unspecified: Secondary | ICD-10-CM | POA: Insufficient documentation

## 2024-02-07 DIAGNOSIS — D631 Anemia in chronic kidney disease: Secondary | ICD-10-CM | POA: Insufficient documentation

## 2024-02-07 DIAGNOSIS — N189 Chronic kidney disease, unspecified: Secondary | ICD-10-CM | POA: Insufficient documentation

## 2024-02-07 LAB — CBC
HCT: 32.5 % — ABNORMAL LOW (ref 36.0–46.0)
Hemoglobin: 10.5 g/dL — ABNORMAL LOW (ref 12.0–15.0)
MCH: 29.5 pg (ref 26.0–34.0)
MCHC: 32.3 g/dL (ref 30.0–36.0)
MCV: 91.3 fL (ref 80.0–100.0)
Platelets: 315 K/uL (ref 150–400)
RBC: 3.56 MIL/uL — ABNORMAL LOW (ref 3.87–5.11)
RDW: 15.6 % — ABNORMAL HIGH (ref 11.5–15.5)
WBC: 8.3 K/uL (ref 4.0–10.5)
nRBC: 0 % (ref 0.0–0.2)

## 2024-02-07 LAB — RENAL FUNCTION PANEL
Albumin: 4 g/dL (ref 3.5–5.0)
Anion gap: 15 (ref 5–15)
BUN: 41 mg/dL — ABNORMAL HIGH (ref 6–20)
CO2: 30 mmol/L (ref 22–32)
Calcium: 9.9 mg/dL (ref 8.9–10.3)
Chloride: 97 mmol/L — ABNORMAL LOW (ref 98–111)
Creatinine, Ser: 1.64 mg/dL — ABNORMAL HIGH (ref 0.44–1.00)
GFR, Estimated: 37 mL/min — ABNORMAL LOW (ref >60)
Glucose, Bld: 103 mg/dL — ABNORMAL HIGH (ref 70–99)
Phosphorus: 3.3 mg/dL (ref 2.5–4.6)
Potassium: 3 mmol/L — ABNORMAL LOW (ref 3.5–5.1)
Sodium: 142 mmol/L (ref 135–145)

## 2024-02-07 LAB — PROTEIN / CREATININE RATIO, URINE
Creatinine, Urine: 165 mg/dL
Protein Creatinine Ratio: 0.41 mg/mg{creat} — ABNORMAL HIGH (ref 0.00–0.15)
Total Protein, Urine: 68 mg/dL

## 2024-03-28 ENCOUNTER — Other Ambulatory Visit (HOSPITAL_COMMUNITY)
Admission: RE | Admit: 2024-03-28 | Discharge: 2024-03-28 | Disposition: A | Discharge status: Home / Self Care | Source: Ambulatory Visit | Attending: Nurse Practitioner | Admitting: Nurse Practitioner

## 2024-03-28 DIAGNOSIS — E559 Vitamin D deficiency, unspecified: Secondary | ICD-10-CM | POA: Insufficient documentation

## 2024-03-28 DIAGNOSIS — E876 Hypokalemia: Secondary | ICD-10-CM | POA: Insufficient documentation

## 2024-03-28 DIAGNOSIS — N189 Chronic kidney disease, unspecified: Secondary | ICD-10-CM | POA: Insufficient documentation

## 2024-03-28 DIAGNOSIS — D631 Anemia in chronic kidney disease: Secondary | ICD-10-CM | POA: Diagnosis present

## 2024-03-28 DIAGNOSIS — R809 Proteinuria, unspecified: Secondary | ICD-10-CM | POA: Insufficient documentation

## 2024-03-28 LAB — RENAL FUNCTION PANEL
Albumin: 4.5 g/dL (ref 3.5–5.0)
Anion gap: 11 (ref 5–15)
BUN: 47 mg/dL — ABNORMAL HIGH (ref 6–20)
CO2: 25 mmol/L (ref 22–32)
Calcium: 10.3 mg/dL (ref 8.9–10.3)
Chloride: 102 mmol/L (ref 98–111)
Creatinine, Ser: 1.38 mg/dL — ABNORMAL HIGH (ref 0.44–1.00)
GFR, Estimated: 45 mL/min — ABNORMAL LOW (ref >60)
Glucose, Bld: 89 mg/dL (ref 70–99)
Phosphorus: 3.9 mg/dL (ref 2.5–4.6)
Potassium: 3.6 mmol/L (ref 3.5–5.1)
Sodium: 138 mmol/L (ref 135–145)

## 2024-03-28 LAB — VITAMIN D 25 HYDROXY (VIT D DEFICIENCY, FRACTURES): Vit D, 25-Hydroxy: 67.48 ng/mL (ref 30–100)

## 2024-03-28 LAB — CBC
HCT: 30.9 % — ABNORMAL LOW (ref 36.0–46.0)
Hemoglobin: 9.6 g/dL — ABNORMAL LOW (ref 12.0–15.0)
MCH: 28.7 pg (ref 26.0–34.0)
MCHC: 31.1 g/dL (ref 30.0–36.0)
MCV: 92.5 fL (ref 80.0–100.0)
Platelets: 298 K/uL (ref 150–400)
RBC: 3.34 MIL/uL — ABNORMAL LOW (ref 3.87–5.11)
RDW: 15.4 % (ref 11.5–15.5)
WBC: 6.6 K/uL (ref 4.0–10.5)
nRBC: 0 % (ref 0.0–0.2)

## 2024-03-28 LAB — PROTEIN / CREATININE RATIO, URINE
Creatinine, Urine: 84 mg/dL
Protein Creatinine Ratio: 0.39 mg/mg{creat} — ABNORMAL HIGH (ref 0.00–0.15)
Total Protein, Urine: 33 mg/dL

## 2024-04-27 NOTE — Progress Notes (Signed)
 " Subjective:    Patient ID: Tina Randolph is an 53 y.o. female.  Chief Complaint:   Chief Complaint  Patient presents with   Follow-up    Paient states that her weight has not been losing weight     Patient identified by name and date of birth. Medication list has been reviewed and updated with the patient. Pt here today for FU of medical problems and lab review as well.   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  Her nephrologist is wanting her to increase the Ozempic to help with weight loss.  She denies any problems with chest pains or shortness of breath.  She has not had any nausea or vomiting.  She has been compliant with her medications without any adverse side effects.  She has not had any fever or chills.  She has not had any focal weakness or TIA symptoms.  Her reflux symptoms are stable.  She has had her mammogram.    Past Medical History:  Diagnosis Date   Anemia    Arthritis    Asthma    Back pain    Cataract    CKD (chronic kidney disease) stage 3, GFR 30-59 ml/min (CMS-HCC)    Coronary artery sclerosis    Diabetes (CMS-HCC)    Difficult airway 06/10/2023   DVT (deep venous thrombosis) (HHS-HCC) (CMS-HCC) 05/2019   Eclampsia    Edema of both legs    Fibromyalgia    GERD (gastroesophageal reflux disease)    Head ache    High  cholesterol    Hypertension    Hyperthyroidism    Joint pain    Limitation of opening of mouth    Murmur    Neck pain    Obstructive sleep apnea on CPAP 09/30/2023   Peptic ulcer    Pituitary abnormality (HCC)    Pneumonia    PONV (postoperative nausea and vomiting)    X1   Postoperative hypothyroidism 08/06/2021   Renal calculi    Seizures (HHS-HCC) (CMS-HCC)    during pregnancy with eclampsia   Shortness of breath on exertion    chronic   Sickle cell trait (HCC)    Transfusion of blood product refused for religious reason    Type 2 diabetes with nephropathy (CMS-HCC) 07/25/2021   Unsteady gait    uses walker   Vitamin D  deficiency 05/04/2019   Past Surgical History:  Procedure Laterality Date   EXPLORATORY LAPAROTOMY     HX BREAST REDUCTION     HX CHOLECYSTECTOMY     HX EGD     HX HYSTERECTOMY     HX KNEE ARTHROSCOPY Bilateral    PR ANTERIOR TIBIAL TUBERCLEPLASTY Bilateral    PR CESAREAN DELIVERY ONLY     3x   PR COLONOSCOPY W/BIOPSY SINGLE/MULTIPLE N/A 08/01/2020   Procedure: COLONOSCOPY (ENDO) W/BIOPSY SINGLE OR MULTIPLE;  Surgeon: Lynne Romaine GAILS, MD;  Location: [facility] ENDOSCOPY   PR COLORECTAL SCRN; HI RISK IND N/A 03/20/2021   Procedure: COLONOSCOPY (ENDO) ON INDIVIDUAL AT HIGH RISK; COLORECTAL CANCER SCREENING;  Surgeon: Mannie Second,  MD;  Location: [facility] ENDOSCOPY   PR COLSC FLX W/RMVL OF TUMOR POLYP LESION SNARE TQ N/A 08/01/2020   Procedure: COLONOSCOPY (ENDO) W/REMOVAL TUMOR/POLYP/LESION BY SNARE;  Surgeon: Lynne Romaine GAILS, MD;  Location: [facility] ENDOSCOPY   PR DSTR NROLYTC AGNT PARVERTEB FCT ADDL LMBR/SACRAL Left 10/18/2019   Procedure: LUMBAR DESTRUCTION BY NEUROLYTIC AGENT PARAVERTEBRAL FACET JOINT ADDITIONAL LEVEL 2ND LEVEL;  Surgeon: Milburn Boros, MD;  Location: [facility] SURG   PR DSTR NROLYTC AGNT PARVERTEB FCT ADDL LMBR/SACRAL Left 10/18/2019   Procedure: LUMBAR DESTRUCTION BY NEUROLYTIC AGENT PARAVERTEBRAL  FACET JOINT ADDITIONAL LEVEL 3RD LEVEL;  Surgeon: Milburn Boros, MD;  Location: [facility]   PR DSTR NROLYTC AGNT PARVERTEB FCT ADDL LMBR/SACRAL Right 10/16/2019   Procedure: LUMBAR DESTRUCTION BY NEUROLYTIC AGENT PARAVERTEBRAL FACET JOINT ADDITIONAL LEVEL 2ND LEVEL;  Surgeon: Milburn Boros, MD;  Location: [facility]   PR DSTR NROLYTC AGNT PARVERTEB FCT ADDL LMBR/SACRAL Right 10/16/2019   Procedure: LUMBAR DESTRUCTION BY NEUROLYTIC AGENT PARAVERTEBRAL FACET JOINT ADDITIONAL LEVEL 3RD LEVEL;  Surgeon: Milburn Boros, MD;  Location: [facility]   PR DSTR NROLYTC AGNT PARVERTEB FCT ADDL LMBR/SACRAL Bilateral 97/72/7974   Procedure: BILATERAL, DESTRUCTION BY NEUROLYTIC AGENT, PARAVERTEBRAL FACET JOINT NERVE(S), WITH IMAGING GUIDANCE (FLUOROSCOPY OR CT); LUMBAR OR SACRAL, EACH ADDITIONAL FACET JOINT;  Surgeon: Gretta Mabel RAMAN, MD;  Location: [facility]   PR DSTR NROLYTC AGNT PARVERTEB FCT SNGL LMBR/SACRAL Left 10/18/2019   Procedure: LUMBAR DESTRUCTION BY NEUROLYTIC AGENT PARAVERTEBRAL FACET JOINT SINGLE LEVEL;  Surgeon: Milburn Boros, MD;  Location: [facility]   PR DSTR NROLYTC AGNT PARVERTEB FCT SNGL LMBR/SACRAL Right 10/16/2019   Procedure: LUMBAR DESTRUCTION BY NEUROLYTIC AGENT PARAVERTEBRAL FACET JOINT SINGLE LEVEL;  Surgeon: Milburn Boros, MD;  Location: [facility]   PR DSTR NROLYTC AGNT PARVERTEB FCT SNGL LMBR/SACRAL Bilateral 08/12/2023   Procedure: BILATERAL,DESTRUCTION BY NEUROLYTIC AGENT,PARAVERTEBRAL FACET JOINT NERVE,WITH IMAGING GUIDANCE; LUMBAR OR SACRAL,SINGLE FACET JOINT;  Surgeon: Gretta Mabel RAMAN, MD;  Location: [facility]   PR EGD TRANSORAL BIOPSY SINGLE/MULTIPLE N/A 08/01/2020   Procedure: EGD (ENDO) ESOPHAGOGASTRODUODENOSCOPY W/BIOPSY SINGLE OR MULTIPLE;  Surgeon: Lynne Romaine GAILS, MD;  Location: [facility] ENDOSCOPY   PR EGD TRANSORAL BIOPSY SINGLE/MULTIPLE N/A 11/04/2023   Procedure: EGD (ENDO) ESOPHAGOGASTRODUODENOSCOPY W/BIOPSY SINGLE OR MULTIPLE;  Surgeon: Zelphia Franky SAUNDERS, MD;  Location: [facility] OR   PR NJX DX/THER AGT PVRT FACET JT LMBR/SAC 1 LEVEL Bilateral 08/16/2019   Procedure: LUMBAR SACRAL DIAGNOSTIC MEDIAN NERVE BRANCH BLOCK;  Surgeon: Milburn Boros, MD;  Location: [facility]   PR NJX DX/THER AGT PVRT FACET JT LMBR/SAC 1 LEVEL Bilateral 09/18/2019   Procedure: LUMBAR SACRAL DIAGNOSTIC MEDIAN NERVE BRANCH BLOCK;  Surgeon: Milburn Boros, MD;  Location: [facility]   PR NJX DX/THER AGT PVRT FACET JT LMBR/SAC 1 LEVEL Bilateral 05/05/2023   Procedure: BILATERAL, INJECTION, DIAGNOSTIC OR THERAPEUTIC AGENT, PARAVERTEBRAL FACET JOINT WITH IMAGE GUIDANCE, LUMBAR OR SACRAL; SINGLE LEVEL;  Surgeon: Gretta Mabel RAMAN, MD;  Location: [facility]   PR NJX DX/THER AGT PVRT FACET JT LMBR/SAC 1 LEVEL Bilateral 05/27/2023   Procedure: BILATERAL, INJECTION, DIAGNOSTIC OR THERAPEUTIC AGENT, PARAVERTEBRAL FACET JOINT WITH IMAGE GUIDANCE, LUMBAR OR SACRAL; SINGLE LEVEL;  Surgeon: Gretta Mabel RAMAN, MD;  Location: [facility]   PR NJX DX/THER AGT PVRT FACET JT LMBR/SAC 2ND LEVEL Bilateral 08/16/2019   Procedure: MEDIAL BRANCH BLOCK, DIAGNOSTIC OR THERAPEUTIC AGENT, SECOND LEVEL (LIST SEPARATELY IN ADDITION TO CODE FOR PRIMARY PROCEDURE);  Surgeon: Milburn,  Osa, MD;  Location: [facility]   PR NJX DX/THER AGT PVRT FACET JT LMBR/SAC 2ND LEVEL Bilateral 09/18/2019   Procedure: LUMBAR SACRAL FACET INJECTION SECOND LEVEL;  Surgeon: Milburn Boros, MD;  Location: [facility]   PR NJX DX/THER AGT PVRT FACET JT LMBR/SAC 2ND LEVEL Bilateral 05/05/2023   Procedure: BILATERAL, SECOND LEVEL, INJECTION, DIAGNOSTIC OR THERAPEUTIC AGENT, PARAVERTEBRAL FACET JOINT (OR NERVES INNERVATING THAT JOINT) WITH IMAGE GUIDANCE (FLUOROSCOPY OR CT), LUMBAR OR SACRAL;  Surgeon: Gretta Mabel RAMAN, MD;  Location: [facility]   PR NJX DX/THER AGT PVRT FACET JT LMBR/SAC 2ND LEVEL Bilateral 05/27/2023   Procedure: BILATERAL, SECOND LEVEL, INJECTION, DIAGNOSTIC OR THERAPEUTIC AGENT, PARAVERTEBRAL  FACET JOINT (OR NERVES INNERVATING THAT JOINT) WITH IMAGE GUIDANCE (FLUOROSCOPY OR CT), LUMBAR OR SACRAL;  Surgeon: Gretta Mabel RAMAN, MD;  Location: [facility]   PR NJX DX/THER AGT PVRT FACET JT LMBR/SAC 3+ LEVEL Bilateral 08/16/2019   Procedure: LUMBAR INJECTION(S),DIAGNOSTIC OR THERAPEUTIC AGENT,PARAVERTEBRAL FACET (ZYGAPOPHYSEAL) JOINT (OR NERVES INNERVATING THAT JOINT) WITH IMAGE GUIDANCE (FLUOROSCOPY OR CT),ADDITIONAL LEVEL(S) 2ND;  Surgeon: Milburn Boros, MD;  Location: [facility]   PR NJX DX/THER AGT PVRT FACET JT LMBR/SAC 3+ LEVEL Bilateral 09/18/2019   Procedure: LUMBAR SACRAL FACET INJECTION ADDITIONAL LEVELS;  Surgeon: Milburn Boros, MD;  Location: [facility]   PR PARATHYROIDECTOMY/EXPLORATION PARATHYROIDS Right 06/10/2023   Procedure: PARATHYROIDECTOMY OR EXPLORATION OF PARATHYROID (S);  Surgeon: Arby Toribio SAUNDERS, MD;  Location: [facility] MAIN OR   PR THYROIDECTOMY TOTAL/COMPLETE Bilateral 06/10/2023   Procedure: THYROIDECTOMY, TOTAL OR COMPLETE;  Surgeon: Arby Toribio SAUNDERS, MD;  Location: [facility] MAIN OR    Family History  Problem Relation Name Age of Onset   Breast Cancer Mother     Prostate Cancer Father     Kidney Disease Sister     Arthritis-osteo Maternal Aunt     Thyroid Disease Maternal Aunt     Prostate Cancer Maternal Uncle     Coronary Artery Disease Maternal Uncle     Breast Cancer Maternal Grandmother     Current Outpatient Medications  Medication Sig Dispense Refill   levothyroxine (SYNTHROID) 137 mcg Tablet take 1 tablet every day by mouth . 90 tablet 1   OZEMPIC 2 mg/dose (8 mg/3 mL) Pen Injector 0.75 mL once a week by Subcutaneous route . 3 mL 5   Diclofenac (VOLTAREN) 1 % Gel APPLY 1 G IN THE MORNING AND 1 G BEFORE BEDTIME BY TOPICAL ROUTE. 100 g 3   calcium carbonate (OS-CAL) 500 mg calcium (1,250 mg) Tablet TAKE 1 TABLET BY MOUTH EVERY DAY IN THE MORNING AND AT BEDTIME 180 tablet 2   methocarbamoL  (ROBAXIN ) 500 mg Tablet TAKE 1 TABLET BY MOUTH  THREE TIMES A DAY AS NEEDED FOR MUSCLE SPASM 90 tablet 1   albuterol (PROVENTIL HFA) 90 mcg/actuation HFA Aerosol Inhaler INHALE 2 PUFFS BY MOUTH EVERY 6 HOURS AS NEEDED FOR FOR SHORTNESS OF BREATH 6.7 each 11   potassium chloride  SA (K-DUR) 20 mEq Tab Sust.Rel. Particle/Crystal TAKE 2 TABLETS BY MOUTH IN THE MORNING AND TAKE 2 TABLETS BEFORE BEDTIME 360 tablet 1   magnesium  oxide (MAG-OX) 400 mg (241.3 mg magnesium ) Tablet TAKE 1 TABLET BY MOUTH EVERY DAY IN THE MORNING 90 tablet 1   metroNIDAZOLE (FLAGYL) 250 mg Tablet take 1 tablet in the morning and 1 tablet at noon and 1 tablet before bedtime by mouth. 42 tablet 0   bismuth subsalicylate (PEPTO-BISMOL) 262 mg Tablet, Chewable take 2 tablets in the  morning and 2 tablets at noon and 2 tablets in the evening and 2 tablets before bedtime by mouth. 112 tablet 0   cyanocobalamin  (VITAMIN B12) 1,000 mcg/mL Solution 1 ML EVERY MONTH BY INTRAMUSCULAR ROUTE MONTHLY. 3 mL 3   allopurinoL (ZYLOPRIM) 100 mg Tablet TAKE 1 TABLET BY MOUTH EVERY DAY IN THE MORNING 90 tablet 1   KERENDIA 10 mg Tablet take 1 tablet every day by mouth .     Cholecalciferol, Vitamin D3, 250 mcg (10,000 unit) Capsule take 1 capsule every day by mouth . 30 capsule 6   oxyCODONE  (ROXICODONE ) 5 mg Tablet take 1 tablet every 6 (six) hours as needed by mouth  for Pain. 8 tablet 0   rosuvastatin (CRESTOR) 10 mg Tablet TAKE 1 TABLET BY MOUTH EVERY DAY 100 tablet 1   nabumetone (RELAFEN) 750 mg Tablet take 1 tablet in the morning and 1 tablet before bedtime by mouth. 180 tablet 0   tiZANidine (ZANAFLEX) 4 mg Tablet take 1 tablet every night by mouth . 90 tablet 2   budesonide-formoterol HFA (SYMBICORT) 160-4.5 mcg/actuation HFA Aerosol Inhaler INHALE 2 PUFFS IN THE MORNING AND INHALE 2 PUFFS BEFORE BEDTIME 10.2 each 6   fluocinonide (LIDEX) 0.05 % Solution 1 APPLICATION IN THE MORNING AND 1 APPLICATION BEFORE BEDTIME BY APPLY EXTERNALLY ROUTE. 60 mL 2   ondansetron  (ZOFRAN   ODT) 4 mg Tablet, Rapid Dissolve take 1 tablet every 8 (eight) hours as needed by mouth  for Nausea. 30 tablet 0   WIXELA INHUB 250-50 mcg/dose Disk with Device TAKE 1 PUFF IN THE MORNING AND 1 PUFF BEFORE BEDTIME BY INHALATION. 60 each 6   docusate sodium (COLACE) 100 mg Capsule take 2 capsules every night by mouth . 180 capsule 1   polyethylene glycol (MIRALAX ) powder take 17 g every morning by mouth  Mix 1 Tablespoon (17 grams) in 8 ounces of water, juice, or milk.. 510 g 3   Syringe with Needle, Safety 3 mL 23 gauge x 1 Syringe 1 Device every month by Does not apply route . 3 each 3   bumetanide (BUMEX) 2 mg Tablet take 1 tablet in the morning and 1 tablet before bedtime by mouth.     valsartan (DIOVAN) 320 mg Tablet take 1 tablet every day by mouth . 30 tablet 0   omeprazole (PRILOSEC) 40 mg Capsule, Delayed Release(E.C.) TAKE 1 CAPSULE TWO TIMES DAILY BEFORE MEALS BY MOUTH . 60 capsule 6   metOLazone (ZAROXOLYN) 5 mg Tablet take 5 mg every day by mouth .     acetaZOLAMIDE (DIAMOX) 250 mg Tablet take 250 mg in the morning and 250 mg at noon and 250 mg before bedtime by mouth.     No current facility-administered medications for this visit.   Allergies  Allergen Reactions   Colchicine Anaphylaxis   Cat Dander Itching    Cats & rabbits   Ibuprofen Swelling and Other - See Comments    Other reaction(s): Other (See Comments) Not allowed NSAIDS kidney pain,     Levaquin [Levofloxacin] Other - See Comments    Pain in BLE, unable to walk due to pain   Nsaids (Non-Steroidal Anti-Inflammatory Drug) Other - See Comments    SWELLING, KIDNEY PAIN, ITCHING   Other Rash    EKG patches   Latex Rash and Itching    Allergic to powder in latex gloves    Social History   Socioeconomic History   Marital status: Married    Spouse name: Not on  file   Number of children: Not on file   Years of education: Not on file   Highest education level: Not on file  Occupational  History   Not on file  Tobacco Use   Smoking status: Former    Types: Cigars    Quit date: 62    Years since quitting: 30.8    Passive exposure: Current   Smokeless tobacco: Never   Tobacco comments:    QUIT IN 1995, 1 cigar a day    01/03/24  Vaping Use   Vaping status: Never Used   Passive vaping exposure: Yes  Substance and Sexual Activity   Alcohol use: Not Currently    Comment: RARE     01/03/24   Drug use: Never    Comment: 01/03/24   Sexual activity: Yes    Partners: Male  Other Topics Concern   Not on file  Social History Narrative   Married with 2 children and 1 step child and 3 children that are deceased 5 grandchildren.   On disability for    Social Drivers of Health   Financial Resource Strain: Medium Risk (01/12/2023)   Overall Financial Resource Strain (CARDIA)    Difficulty of Paying Living Expenses: Somewhat hard  Food Insecurity: No Food Insecurity (01/12/2023)   Hunger Vital Sign    Worried About Running Out of Food in the Last Year: Never true    Ran Out of Food in the Last Year: Never true  Transportation Needs: No Transportation Needs (02/08/2023)   OASIS A1250: Transportation    Lack of Transportation (Medical): No    Lack of Transportation (Non-Medical): No    Patient Unable or Declines to Respond: No  Physical Activity: Inactive (01/12/2023)   Exercise Vital Sign    Days of Exercise per Week: 0 days    Minutes of Exercise per Session: 0 min  Stress: No Stress Concern Present (01/12/2023)   [employer] of Occupational Health - Occupational Stress Questionnaire    Feeling of Stress : Not at all  Social Connections: Moderately Isolated (01/12/2023)   Social Connection and Isolation Panel    Frequency of Communication with Friends and Family: More than three times a week    Frequency of Social Gatherings with Friends and Family: More than three times a week    Attends Religious Services: More than 4 times per year     Active Member of [employer] or Organizations: No    Attends Banker Meetings: Never    Marital Status: Separated  Intimate Partner Violence: Patient Declined (01/12/2023)   Humiliation, Afraid, Rape, and Kick questionnaire    Fear of Current or Ex-Partner: Patient declined    Emotionally Abused: Patient declined    Physically Abused: Patient declined    Sexually Abused: Patient declined  Housing Stability: Low Risk  (01/12/2023)   Housing Stability Vital Sign    Unable to Pay for Housing in the Last Year: No    Number of Places Lived in the Last Year: 1    Unstable Housing in the Last Year: No   Review of Systems  Constitutional:  Positive for malaise/fatigue.  HENT: Negative.    Eyes: Negative.   Respiratory: Negative.    Cardiovascular:  Positive for leg swelling.  Gastrointestinal: Negative.   Genitourinary: Negative.   Musculoskeletal:  Positive for back pain and joint pain.  Skin: Negative.   Neurological: Negative.   Endo/Heme/Allergies: Negative.   Psychiatric/Behavioral: Negative.      Objective:  BP  126/68 (BP Location: Left arm, Patient Position: Sitting)   Pulse 80   Temp 98.1 F (36.7 C) (Forehead Scan)   Resp 16   Ht 1.6 m (5' 3)   Wt 89.4 kg (197 lb)   LMP 01/14/2003   SpO2 95%   BMI 34.90 kg/m  Body mass index is 34.9 kg/m.  Physical Exam Vitals and nursing note reviewed.  Constitutional:      Appearance: Normal appearance.  Cardiovascular:     Rate and Rhythm: Normal rate and regular rhythm.     Pulses: Normal pulses.     Heart sounds: Normal heart sounds.  Pulmonary:     Effort: Pulmonary effort is normal. No respiratory distress.     Breath sounds: Normal breath sounds. No wheezing.  Abdominal:     General: Bowel sounds are normal.     Palpations: Abdomen is soft.  Musculoskeletal:        General: Swelling present.     Cervical back: Neck supple.  Neurological:     Mental Status: She is alert.           Assessment:      ICD-10-CM   1. Type 2 diabetes with nephropathy (CMS-HCC)  E11.21 Her diabetes is currently well-controlled.  Her Ozempic will be increased to 2 mg to help with weight loss.    2. Anemia, unspecified type  D64.9 Her blood counts are stable and she has not had any bleeding problems    3. Chronic deep vein thrombosis (DVT) of distal vein of both lower extremities (HHS-HCC) (CMS-HCC)  I82.5Z3 She has not had any evidence of recurrent DVT.    4. Asthma, unspecified asthma severity, unspecified whether complicated, unspecified whether persistent  J45.909 Her asthma is stable and she has not had any acute exacerbations.    5. Diverticulosis  K57.90 Continue high-fiber diet for her diverticulosis    6. Gastroesophageal reflux disease, unspecified whether esophagitis present  K21.9 Her reflux symptoms are currently controlled    7. Hyperlipidemia, unspecified hyperlipidemia type  E78.5 Her lipids are stable and she is to continue Crestor and low-fat diet    8. Hypertension, benign  I10 Her blood pressure is well-controlled and she is to continue her current antihypertensive regimen    9. Hypokalemia  E87.6 She is to continue potassium supplementation    10. Morbid obesity (CMS-HCC)  E66.01 Continue attempts at weight loss    11. Postoperative hypothyroidism  E89.0 Her thyroid levels are low.  She is to increase her levothyroxine to 137 mcg daily    12. Stage 3b chronic kidney disease (CMS-HCC)  N18.32 Her kidney function is stable and she is to continue follow-up with nephrology    13. Vitamin D  deficiency  E55.9 Continue vitamin D     14. Need for influenza vaccination  Z23 FLU: (FLULAVAL/FLUARIX/FLUZONE) (6 MONTHS +)      Plan:   She is to follow-up in 3 months to see how she is doing on the higher dose of Ozempic  This note was dictated using Dragon [employer] One voice recognition software may contain the occasional inadvertent typographical or nomenclature errors. Please contact me  for clarification questions    "

## 2024-06-10 ENCOUNTER — Emergency Department (HOSPITAL_COMMUNITY)

## 2024-06-10 ENCOUNTER — Encounter (HOSPITAL_COMMUNITY): Payer: Self-pay | Admitting: Emergency Medicine

## 2024-06-10 ENCOUNTER — Emergency Department (HOSPITAL_COMMUNITY)
Admission: EM | Admit: 2024-06-10 | Discharge: 2024-06-10 | Disposition: A | Discharge status: Home / Self Care | Attending: Emergency Medicine | Admitting: Emergency Medicine

## 2024-06-10 ENCOUNTER — Other Ambulatory Visit: Payer: Self-pay

## 2024-06-10 DIAGNOSIS — Z91040 Latex allergy status: Secondary | ICD-10-CM | POA: Insufficient documentation

## 2024-06-10 DIAGNOSIS — E871 Hypo-osmolality and hyponatremia: Secondary | ICD-10-CM | POA: Insufficient documentation

## 2024-06-10 DIAGNOSIS — D72829 Elevated white blood cell count, unspecified: Secondary | ICD-10-CM | POA: Diagnosis not present

## 2024-06-10 DIAGNOSIS — N183 Chronic kidney disease, stage 3 unspecified: Secondary | ICD-10-CM | POA: Insufficient documentation

## 2024-06-10 DIAGNOSIS — E1122 Type 2 diabetes mellitus with diabetic chronic kidney disease: Secondary | ICD-10-CM | POA: Insufficient documentation

## 2024-06-10 DIAGNOSIS — K5900 Constipation, unspecified: Secondary | ICD-10-CM

## 2024-06-10 DIAGNOSIS — E039 Hypothyroidism, unspecified: Secondary | ICD-10-CM | POA: Diagnosis not present

## 2024-06-10 DIAGNOSIS — E876 Hypokalemia: Secondary | ICD-10-CM | POA: Diagnosis not present

## 2024-06-10 DIAGNOSIS — R600 Localized edema: Secondary | ICD-10-CM | POA: Diagnosis not present

## 2024-06-10 DIAGNOSIS — M791 Myalgia, unspecified site: Secondary | ICD-10-CM | POA: Diagnosis present

## 2024-06-10 LAB — BASIC METABOLIC PANEL WITH GFR
Anion gap: 19 — ABNORMAL HIGH (ref 5–15)
BUN: 49 mg/dL — ABNORMAL HIGH (ref 6–20)
CO2: 29 mmol/L (ref 22–32)
Calcium: 11 mg/dL — ABNORMAL HIGH (ref 8.9–10.3)
Chloride: 93 mmol/L — ABNORMAL LOW (ref 98–111)
Creatinine, Ser: 1.63 mg/dL — ABNORMAL HIGH (ref 0.44–1.00)
GFR, Estimated: 37 mL/min — ABNORMAL LOW
Glucose, Bld: 91 mg/dL (ref 70–99)
Potassium: 2.4 mmol/L — CL (ref 3.5–5.1)
Sodium: 140 mmol/L (ref 135–145)

## 2024-06-10 LAB — RESP PANEL BY RT-PCR (RSV, FLU A&B, COVID)  RVPGX2
Influenza A by PCR: NEGATIVE
Influenza B by PCR: NEGATIVE
Resp Syncytial Virus by PCR: NEGATIVE
SARS Coronavirus 2 by RT PCR: NEGATIVE

## 2024-06-10 LAB — CBC
HCT: 34.9 % — ABNORMAL LOW (ref 36.0–46.0)
Hemoglobin: 11.1 g/dL — ABNORMAL LOW (ref 12.0–15.0)
MCH: 29 pg (ref 26.0–34.0)
MCHC: 31.8 g/dL (ref 30.0–36.0)
MCV: 91.1 fL (ref 80.0–100.0)
Platelets: 385 K/uL (ref 150–400)
RBC: 3.83 MIL/uL — ABNORMAL LOW (ref 3.87–5.11)
RDW: 14.2 % (ref 11.5–15.5)
WBC: 10.7 K/uL — ABNORMAL HIGH (ref 4.0–10.5)
nRBC: 0 % (ref 0.0–0.2)

## 2024-06-10 LAB — URINALYSIS, ROUTINE W REFLEX MICROSCOPIC
Bacteria, UA: NONE SEEN
Bilirubin Urine: NEGATIVE
Glucose, UA: NEGATIVE mg/dL
Hgb urine dipstick: NEGATIVE
Ketones, ur: NEGATIVE mg/dL
Nitrite: NEGATIVE
Protein, ur: 30 mg/dL — AB
Specific Gravity, Urine: 1.009 (ref 1.005–1.030)
pH: 7 (ref 5.0–8.0)

## 2024-06-10 LAB — POTASSIUM: Potassium: 2.9 mmol/L — ABNORMAL LOW (ref 3.5–5.1)

## 2024-06-10 LAB — GROUP A STREP BY PCR: Group A Strep by PCR: NOT DETECTED

## 2024-06-10 LAB — MAGNESIUM: Magnesium: 1.6 mg/dL — ABNORMAL LOW (ref 1.7–2.4)

## 2024-06-10 MED ORDER — SODIUM CHLORIDE 0.9 % IV BOLUS
1000.0000 mL | Freq: Once | INTRAVENOUS | Status: AC
  Administered 2024-06-10: 1000 mL via INTRAVENOUS

## 2024-06-10 MED ORDER — POTASSIUM CHLORIDE 10 MEQ/100ML IV SOLN
10.0000 meq | INTRAVENOUS | Status: AC
  Administered 2024-06-10 (×3): 10 meq via INTRAVENOUS
  Filled 2024-06-10 (×3): qty 100

## 2024-06-10 MED ORDER — DIPHENHYDRAMINE HCL 50 MG/ML IJ SOLN
12.5000 mg | Freq: Once | INTRAMUSCULAR | Status: AC
  Administered 2024-06-10: 12.5 mg via INTRAVENOUS
  Filled 2024-06-10: qty 1

## 2024-06-10 MED ORDER — ONDANSETRON 4 MG PO TBDP
4.0000 mg | ORAL_TABLET | Freq: Once | ORAL | Status: AC
  Administered 2024-06-10: 4 mg via ORAL
  Filled 2024-06-10: qty 1

## 2024-06-10 MED ORDER — POTASSIUM CHLORIDE CRYS ER 20 MEQ PO TBCR
40.0000 meq | EXTENDED_RELEASE_TABLET | Freq: Once | ORAL | Status: AC
  Administered 2024-06-10: 40 meq via ORAL
  Filled 2024-06-10: qty 2

## 2024-06-10 MED ORDER — MORPHINE SULFATE (PF) 4 MG/ML IV SOLN
4.0000 mg | Freq: Once | INTRAVENOUS | Status: AC
  Administered 2024-06-10: 4 mg via INTRAMUSCULAR
  Filled 2024-06-10: qty 1

## 2024-06-10 MED ORDER — MAGNESIUM SULFATE 2 GM/50ML IV SOLN
2.0000 g | Freq: Once | INTRAVENOUS | Status: AC
  Administered 2024-06-10: 2 g via INTRAVENOUS
  Filled 2024-06-10: qty 50

## 2024-06-10 MED ORDER — POTASSIUM CHLORIDE 10 MEQ/100ML IV SOLN
10.0000 meq | Freq: Once | INTRAVENOUS | Status: AC
  Administered 2024-06-10: 10 meq via INTRAVENOUS
  Filled 2024-06-10: qty 100

## 2024-06-10 MED ORDER — POLYETHYLENE GLYCOL 3350 17 GM/SCOOP PO POWD: 17.0000 g | Freq: Every day | ORAL | 0 refills | Status: AC

## 2024-06-10 NOTE — ED Provider Notes (Signed)
" °  Physical Exam  BP (!) 146/85   Pulse 85   Temp 99.2 F (37.3 C) (Oral)   Resp 18   Ht 5' 3 (1.6 m)   Wt 85.4 kg   SpO2 96%   BMI 33.34 kg/m   Physical Exam  Procedures  Procedures  ED Course / MDM    Medical Decision Making Amount and/or Complexity of Data Reviewed Labs: ordered. Radiology: ordered.  Risk OTC drugs. Prescription drug management.  Given for sore throat, labs were ordered and noted to have hypokalemia and hypomagnesemia.  These are being repleted, recheck potassium, if improving will likely discharge after she completes the course. Patient potassium has gone up to 2.9, she got 2 additional rounds of potassium and then was discharged.       Suellen Sherran LABOR, [place] 06/10/24 [id number]  "

## 2024-06-10 NOTE — Discharge Instructions (Addendum)
 Valuated today for sore throat, you did not have COVID flu or RSV and you did not have strep throat.  On your labs we did notice that her potassium was very low at 2.4, your magnesium  was low at 1.6 and your calcium was slightly high at 11.  We gave you IV fluids to help bring your calcium down, and repleted your magnesium  and potassium.  Please continue taking your potassium at home and call Dr. Rachele or your PCP on Monday morning for close recheck of your electrolytes.  If you have new or worsening symptoms come back to the ER right away.

## 2024-06-10 NOTE — ED Triage Notes (Signed)
 Pov c/o sore throat, generalized body aches, n/v, bilateral flank pain since around December 20. Laryngitis started December 24.  Denies fever or difficulty swallowing or breathing.

## 2024-06-10 NOTE — ED Provider Notes (Signed)
 " [place] [facility]ERGENCY DEPARTMENT AT [facility] Provider Note   CSN: [id number] Arrival date & time: 06/10/24  1202     Patient presents with: Sore Throat   Tina Randolph is a 53 y.o. 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 Rachele presenting for evaluation of generalized bodyaches, sore throat which has been present for approximately the last week.  She also notes developing on again off again laryngitis for the past 3 days.  She has had no documented fevers but has had some chills in addition reports of bilateral flank pain, she has had reduced p.o. intake and endorses she has had reduced urinary frequency although also states she has had reduced p.o. intake.  She denies dysuria, she does report probable constipation stating has been at least several days since her last bowel movement.  She denies abdominal pain or rectal pain.  She has had increased ankle edema.  She denies shortness of breath, endorses a nonproductive cough.   The history is provided by the patient.       Prior to Admission medications  Medication Sig Start Date End Date Taking? Authorizing Provider  albuterol (PROAIR HFA) 108 (90 BASE) MCG/ACT inhaler Inhale 2 puffs into the lungs every 6 (six) hours as needed for shortness of breath.    [provider]  allopurinol (ZYLOPRIM) 100 MG tablet Take 100 mg by mouth daily. 2 tablets once daily    [provider]  bismuth subsalicylate (PEPTO BISMOL) 262 MG chewable tablet Chew 524 mg by mouth as needed.    [provider]  budesonide-formoterol (SYMBICORT) 160-4.5 MCG/ACT inhaler Inhale 2 puffs into the lungs 2 (two) times daily.    [provider]  bumetanide (BUMEX) 2 MG tablet Take 2 mg by mouth 2 (two) times daily.    [provider]  Cholecalciferol (VITAMIN D  PO) Take 1 tablet by  mouth daily.    [provider]  Cholecalciferol (VITAMIN D3) 250 MCG (10000 UT) capsule Take 10,000 Units by mouth daily.    [provider]  Cyanocobalamin  1000 MCG/ML KIT Inject 1,000 mcg as directed every 30 (thirty) days.    [provider]  diclofenac Sodium (VOLTAREN) 1 % GEL Apply 1 g topically 2 (two) times daily.    [provider]  doxycycline (VIBRAMYCIN) 100 MG capsule Take 100 mg by mouth 2 (two) times daily.    [provider]  Ergocalciferol  (VITAMIN D2 PO) Take by mouth.    [provider]  estradiol (CLIMARA - DOSED IN MG/24 HR) 0.05 mg/24hr patch Place 0.05 mg onto the skin once a week.    [provider]  ferrous sulfate 325 (65 FE) MG tablet Take 325 mg by mouth every evening.    [provider]  fluocinonide (LIDEX) 0.05 % external solution Apply 1 Application topically 2 (two) times daily.    [provider]  levothyroxine (SYNTHROID) 112 MCG tablet Take 112 mcg by mouth daily before breakfast.    [provider]  MAGNESIUM  PO Take 400 mg by mouth in the morning.    [provider]  methocarbamol  (ROBAXIN ) 500 MG tablet Take 500 mg by mouth 3 (three) times daily as needed for muscle spasms.    [provider]  metolazone (ZAROXOLYN) 5 MG tablet Take 5 mg by mouth daily.    [provider]  omeprazole (PRILOSEC) 20  MG capsule Take 20 mg by mouth 2 (two) times daily.    [provider]  Allie Gutta Calcium 500 MG TABS Take 1 tablet by mouth in the morning and at bedtime.    [provider]  potassium chloride  (K-DUR) 10 MEQ tablet Take 20 mEq by mouth 2 (two) times daily.    [provider]  Semaglutide, 1 MG/DOSE, (OZEMPIC, 1 MG/DOSE,) 4 MG/3ML SOPN Inject 1 mg into the skin once a week.    [provider]  tiZANidine (ZANAFLEX) 4 MG tablet Take 4 mg by mouth at bedtime.    [provider]  valsartan (DIOVAN) 320 MG  tablet Take 160 mg by mouth daily.    [provider]    Allergies: Colchicine, Ibuprofen, Latex, and Other    Review of Systems  Constitutional:  Positive for chills, fatigue and fever.  HENT:  Positive for sore throat. Negative for congestion.   Eyes: Negative.   Respiratory:  Positive for cough. Negative for chest tightness and shortness of breath.   Cardiovascular:  Negative for chest pain.  Gastrointestinal:  Negative for abdominal pain, nausea and vomiting.  Genitourinary:  Positive for decreased urine volume and flank pain. Negative for dysuria and hematuria.  Musculoskeletal:  Negative for arthralgias, joint swelling and neck pain.  Skin: Negative.  Negative for rash and wound.  Neurological:  Negative for dizziness, weakness, light-headedness, numbness and headaches.  Psychiatric/Behavioral: Negative.      Updated Vital Signs BP (!) 146/85   Pulse 85   Temp 99.2 F (37.3 C) (Oral)   Resp 18   Ht 5' 3 (1.6 m)   Wt 85.4 kg   SpO2 96%   BMI 33.34 kg/m   Physical Exam Vitals and nursing note reviewed.  Constitutional:      Appearance: She is well-developed.  HENT:     Head: Normocephalic and atraumatic.     Mouth/Throat:     Pharynx: Posterior oropharyngeal erythema present. No oropharyngeal exudate.     Comments: Mild posterior pharyngeal erythema, no exudate.  No tonsillar hypertrophy, uvula midline. Eyes:     Conjunctiva/sclera: Conjunctivae normal.  Cardiovascular:     Rate and Rhythm: Normal rate and regular rhythm.     Heart sounds: Normal heart sounds.  Pulmonary:     Effort: Pulmonary effort is normal.     Breath sounds: Normal breath sounds. No wheezing, rhonchi or rales.  Abdominal:     General: Bowel sounds are normal.     Palpations: Abdomen is soft.     Tenderness: There is no abdominal tenderness. There is no guarding.  Musculoskeletal:        General: Normal range of motion.     Cervical back: Normal range of motion.     Comments:  Trace bilateral ankle edema.  Skin:    General: Skin is warm and dry.  Neurological:     General: No focal deficit present.     Mental Status: She is alert.     (all labs ordered are listed, but only abnormal results are displayed) Labs Reviewed  URINALYSIS, ROUTINE W REFLEX MICROSCOPIC - Abnormal; Notable for the following components:      Result Value   Color, Urine STRAW (*)    Protein, ur 30 (*)    Leukocytes,Ua TRACE (*)    All other components within normal limits  BASIC METABOLIC PANEL WITH GFR - Abnormal; Notable for the following components:   Potassium 2.4 (*)    Chloride  93 (*)    BUN 49 (*)    Creatinine, Ser 1.63 (*)    Calcium 11.0 (*)    GFR, Estimated 37 (*)    Anion gap 19 (*)    All other components within normal limits  CBC - Abnormal; Notable for the following components:   WBC 10.7 (*)    RBC 3.83 (*)    Hemoglobin 11.1 (*)    HCT 34.9 (*)    All other components within normal limits  MAGNESIUM  - Abnormal; Notable for the following components:   Magnesium  1.6 (*)    All other components within normal limits  POTASSIUM - Abnormal; Notable for the following components:   Potassium 2.9 (*)    All other components within normal limits  RESP PANEL BY RT-PCR (RSV, FLU A&B, COVID)  [id number]  GROUP A STREP BY PCR    EKG: None  Radiology: DG ABD ACUTE 2+V W 1V CHEST Result Date: 06/10/2024 CLINICAL DATA:  Sore throat with generalized body aches, nausea, vomiting and bilateral flank pain. EXAM: DG ABDOMEN ACUTE WITH 1 VIEW CHEST COMPARISON:  November 26, 2023 FINDINGS: There is no evidence of dilated bowel loops or free intraperitoneal air. A moderate to large stool burden is noted. Radiopaque surgical clips are seen within the right upper quadrant. No radiopaque calculi or other significant radiographic abnormality is seen. Heart size and mediastinal contours are within normal limits. Both lungs are clear. IMPRESSION: Moderate to large stool burden without  evidence of bowel obstruction. Electronically Signed   By: Suzen Dials M.D.   On: 06/10/2024 15:11     Procedures   Medications Ordered in the ED  potassium chloride  10 mEq in 100 mL IVPB ( Intravenous Canceled Entry 06/10/24 1903)  morphine  (PF) 4 MG/ML injection 4 mg (4 mg Intramuscular Given 06/10/24 1453)  ondansetron  (ZOFRAN -ODT) disintegrating tablet 4 mg (4 mg Oral Given 06/10/24 1452)  potassium chloride  SA (KLOR-CON  M) CR tablet 40 mEq (40 mEq Oral Given 06/10/24 1540)  diphenhydrAMINE  (BENADRYL ) injection 12.5 mg (12.5 mg Intravenous Given 06/10/24 1551)  magnesium  sulfate IVPB 2 g 50 mL (0 g Intravenous Stopped 06/10/24 1841)  sodium chloride  0.9 % bolus 1,000 mL (1,000 mLs Intravenous New Bag/Given 06/10/24 1919)                                    Medical Decision Making Patient presenting with sore throat and flulike symptoms, also with bilateral flank pain and reduced urination, patient concerned secondary to her history of chronic renal insufficiency.  Differential including influenza, strep throat, other viral overall respiratory process, could also represent UTI, pyelonephritis, dehydration, musculoskeletal source of pain, she does not have a history of kidney stones and there is no hemoglobin in her urine, I doubt her symptoms are secondary to nephrolithiasis.  Labs are significant for a hypokalemia and hypomagnesemia which she states she frequently has, she is chronically on Bumex but takes oral potassium as well.  She was given oral 40 mill equivalents along with 2 runs of IV potassium.  Repeat potassium is now 2.9.  Plan will be to give her 2 additional rounds of potassium and she can probably be discharged with follow-up with her nephrologist.  Signed out to [facility], PA-C.  Amount and/or Complexity of Data Reviewed Labs: ordered.    Details: Labs significant for essentially negative urinalysis, also negative strep and negative respiratory panel including  influenza.  Her CBC with a slight leukocytosis at 10.7, her hemoglobin is 11.1 but stable.  Her potassium is 2.4 initially, repeated after several rounds and oral and now 2.9, her magnesium  is 1.6, she was given 2 g of magnesium  IV to replace this.  She also has a creatinine today 1.63 with a GFR of 37 which is relatively stable.  She has a hypocalcemia of 11.0, IV fluids have been ordered to help dilute this. Radiology: ordered.    Details: Acute abdominal series obtained, no acute cardiopulmonary findings, no obvious radio opaque calculi.  Moderate to large stool burden.  Risk Prescription drug management.        Final diagnoses:  Hypokalemia  Hyponatremia  Hypercalcemia    ED Discharge Orders     None          Birdena Mliss RIGGERS 06/10/24 1931    Francesca Elsie CROME, MD 06/10/24 2248  "

## 2024-06-23 ENCOUNTER — Other Ambulatory Visit (HOSPITAL_COMMUNITY)
Admission: RE | Admit: 2024-06-23 | Discharge: 2024-06-23 | Disposition: A | Source: Ambulatory Visit | Attending: Nephrology | Admitting: Nephrology

## 2024-06-23 DIAGNOSIS — E119 Type 2 diabetes mellitus without complications: Secondary | ICD-10-CM | POA: Diagnosis present

## 2024-06-23 LAB — RENAL FUNCTION PANEL
Albumin: 4.6 g/dL (ref 3.5–5.0)
Anion gap: 17 — ABNORMAL HIGH (ref 5–15)
BUN: 23 mg/dL — ABNORMAL HIGH (ref 6–20)
CO2: 24 mmol/L (ref 22–32)
Calcium: 9.4 mg/dL (ref 8.9–10.3)
Chloride: 98 mmol/L (ref 98–111)
Creatinine, Ser: 1.1 mg/dL — ABNORMAL HIGH (ref 0.44–1.00)
GFR, Estimated: 59 mL/min — ABNORMAL LOW
Glucose, Bld: 105 mg/dL — ABNORMAL HIGH (ref 70–99)
Phosphorus: 2.6 mg/dL (ref 2.5–4.6)
Potassium: 3.8 mmol/L (ref 3.5–5.1)
Sodium: 139 mmol/L (ref 135–145)

## 2024-06-23 LAB — CBC
HCT: 33.4 % — ABNORMAL LOW (ref 36.0–46.0)
Hemoglobin: 10.4 g/dL — ABNORMAL LOW (ref 12.0–15.0)
MCH: 29.1 pg (ref 26.0–34.0)
MCHC: 31.1 g/dL (ref 30.0–36.0)
MCV: 93.6 fL (ref 80.0–100.0)
Platelets: 333 K/uL (ref 150–400)
RBC: 3.57 MIL/uL — ABNORMAL LOW (ref 3.87–5.11)
RDW: 14.8 % (ref 11.5–15.5)
WBC: 7.9 K/uL (ref 4.0–10.5)
nRBC: 0 % (ref 0.0–0.2)

## 2024-06-24 LAB — MICROALBUMIN / CREATININE URINE RATIO
Creatinine, Urine: 179.4 mg/dL
Microalb Creat Ratio: 330 mg/g{creat} — ABNORMAL HIGH (ref 0–29)
Microalb, Ur: 592.8 ug/mL — ABNORMAL HIGH

## 2024-06-24 LAB — PARATHYROID HORMONE, INTACT (NO CA): PTH: 29 pg/mL (ref 15–65)
